# Patient Record
Sex: Male | Born: 1975
Health system: Southern US, Community
[De-identification: ages and names within clinical notes are randomized; demographics above are authoritative.]

## PROBLEM LIST (undated history)

## (undated) DIAGNOSIS — R011 Cardiac murmur, unspecified: Secondary | ICD-10-CM

## (undated) DIAGNOSIS — J111 Influenza due to unidentified influenza virus with other respiratory manifestations: Secondary | ICD-10-CM

## (undated) DIAGNOSIS — J45909 Unspecified asthma, uncomplicated: Secondary | ICD-10-CM

## (undated) DIAGNOSIS — T7840XA Allergy, unspecified, initial encounter: Secondary | ICD-10-CM

## (undated) DIAGNOSIS — M1990 Unspecified osteoarthritis, unspecified site: Secondary | ICD-10-CM

## (undated) DIAGNOSIS — G4730 Sleep apnea, unspecified: Secondary | ICD-10-CM

## (undated) HISTORY — DX: Allergy, unspecified, initial encounter: T78.40XA

## (undated) HISTORY — PX: WISDOM TOOTH EXTRACTION: SHX21

## (undated) HISTORY — DX: Influenza due to unidentified influenza virus with other respiratory manifestations: J11.1

## (undated) HISTORY — DX: Unspecified osteoarthritis, unspecified site: M19.90

## (undated) HISTORY — DX: Sleep apnea, unspecified: G47.30

## (undated) HISTORY — DX: Cardiac murmur, unspecified: R01.1

## (undated) HISTORY — DX: Unspecified asthma, uncomplicated: J45.909

---

## 1998-07-20 ENCOUNTER — Inpatient Hospital Stay (HOSPITAL_COMMUNITY): Admission: AD | Admit: 1998-07-20 | Discharge: 1998-07-24 | Payer: Self-pay | Admitting: *Deleted

## 2005-11-04 ENCOUNTER — Ambulatory Visit: Payer: Self-pay | Admitting: Family Medicine

## 2005-11-08 ENCOUNTER — Ambulatory Visit: Payer: Self-pay | Admitting: Family Medicine

## 2005-12-17 ENCOUNTER — Ambulatory Visit: Payer: Self-pay | Admitting: Family Medicine

## 2006-03-06 ENCOUNTER — Ambulatory Visit: Payer: Self-pay | Admitting: Family Medicine

## 2006-04-24 ENCOUNTER — Ambulatory Visit: Payer: Self-pay | Admitting: Family Medicine

## 2006-10-20 ENCOUNTER — Ambulatory Visit: Payer: Self-pay | Admitting: Family Medicine

## 2015-10-01 DIAGNOSIS — J029 Acute pharyngitis, unspecified: Secondary | ICD-10-CM | POA: Diagnosis not present

## 2015-10-01 DIAGNOSIS — J309 Allergic rhinitis, unspecified: Secondary | ICD-10-CM | POA: Diagnosis not present

## 2015-12-22 DIAGNOSIS — H5213 Myopia, bilateral: Secondary | ICD-10-CM | POA: Diagnosis not present

## 2016-04-09 DIAGNOSIS — B029 Zoster without complications: Secondary | ICD-10-CM | POA: Diagnosis not present

## 2016-04-09 DIAGNOSIS — J029 Acute pharyngitis, unspecified: Secondary | ICD-10-CM | POA: Diagnosis not present

## 2016-04-09 DIAGNOSIS — J45909 Unspecified asthma, uncomplicated: Secondary | ICD-10-CM | POA: Diagnosis not present

## 2016-05-24 ENCOUNTER — Other Ambulatory Visit: Payer: Self-pay | Admitting: Cardiology

## 2016-05-24 DIAGNOSIS — R0789 Other chest pain: Secondary | ICD-10-CM

## 2016-05-24 DIAGNOSIS — R079 Chest pain, unspecified: Secondary | ICD-10-CM | POA: Diagnosis not present

## 2016-05-24 DIAGNOSIS — Z8249 Family history of ischemic heart disease and other diseases of the circulatory system: Secondary | ICD-10-CM | POA: Diagnosis not present

## 2016-06-07 ENCOUNTER — Ambulatory Visit
Admission: RE | Admit: 2016-06-07 | Discharge: 2016-06-07 | Disposition: A | Discharge status: Home / Self Care | Payer: No Typology Code available for payment source | Source: Ambulatory Visit | Attending: Cardiology | Admitting: Cardiology

## 2016-06-07 DIAGNOSIS — Z8249 Family history of ischemic heart disease and other diseases of the circulatory system: Secondary | ICD-10-CM | POA: Diagnosis not present

## 2016-06-07 DIAGNOSIS — R079 Chest pain, unspecified: Secondary | ICD-10-CM | POA: Diagnosis not present

## 2016-06-07 DIAGNOSIS — R0789 Other chest pain: Secondary | ICD-10-CM

## 2017-03-23 DIAGNOSIS — Z23 Encounter for immunization: Secondary | ICD-10-CM | POA: Diagnosis not present

## 2017-05-09 DIAGNOSIS — J301 Allergic rhinitis due to pollen: Secondary | ICD-10-CM | POA: Diagnosis not present

## 2017-05-09 DIAGNOSIS — J309 Allergic rhinitis, unspecified: Secondary | ICD-10-CM | POA: Diagnosis not present

## 2017-05-09 DIAGNOSIS — J4520 Mild intermittent asthma, uncomplicated: Secondary | ICD-10-CM | POA: Diagnosis not present

## 2017-05-09 DIAGNOSIS — J3089 Other allergic rhinitis: Secondary | ICD-10-CM | POA: Diagnosis not present

## 2017-05-12 ENCOUNTER — Other Ambulatory Visit: Payer: Self-pay | Admitting: Allergy

## 2017-05-12 ENCOUNTER — Ambulatory Visit
Admission: RE | Admit: 2017-05-12 | Discharge: 2017-05-12 | Disposition: A | Discharge status: Home / Self Care | Payer: BLUE CROSS/BLUE SHIELD | Source: Ambulatory Visit | Attending: Allergy | Admitting: Allergy

## 2017-05-12 DIAGNOSIS — R062 Wheezing: Secondary | ICD-10-CM | POA: Diagnosis not present

## 2017-05-12 DIAGNOSIS — J4520 Mild intermittent asthma, uncomplicated: Secondary | ICD-10-CM

## 2017-12-11 IMAGING — CT CT HEART SCORING
1 of 2 series · 11 of 20 positions shown, 14 images · non-contrast
Comparison: None.

CLINICAL DATA: Could intermittent chest discomfort.

EXAM:
CT HEART FOR CALCIUM SCORING
TECHNIQUE: CT heart was performed on a 64 channel system using prospective ECG
gating.
A non-contrast exam for calcium scoring was performed.
Note that this exam targets the heart and the chest was not imaged
in its entirety.

[Series 2: smartscore - gated 0.4 sec · axial · 0.49mm/px · z∈[-247,-132]mm · 11 of 56 slices shown, 14 images]
[im 5/56  vessel]
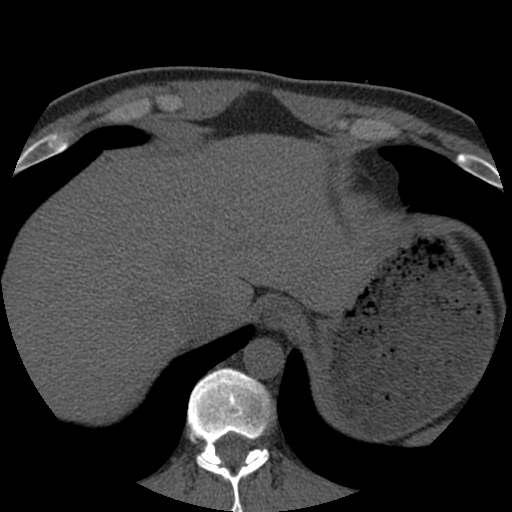
[im 5/56  lung]
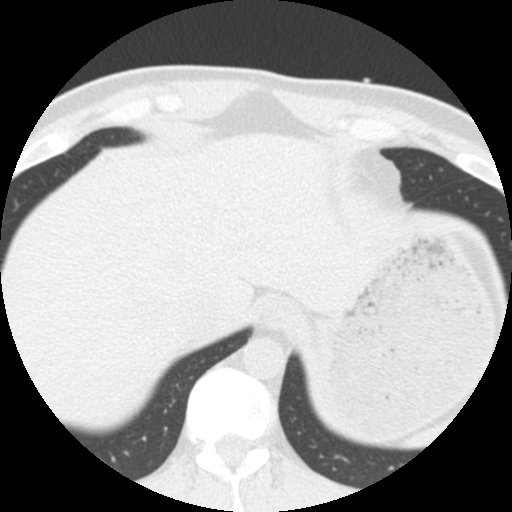
[im 10/56  vessel]
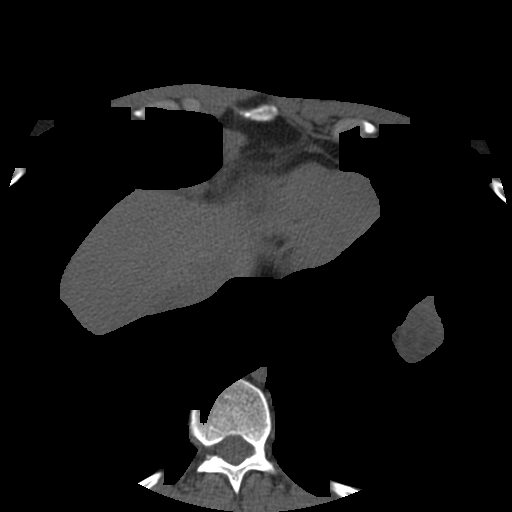
[im 14/56  vessel]
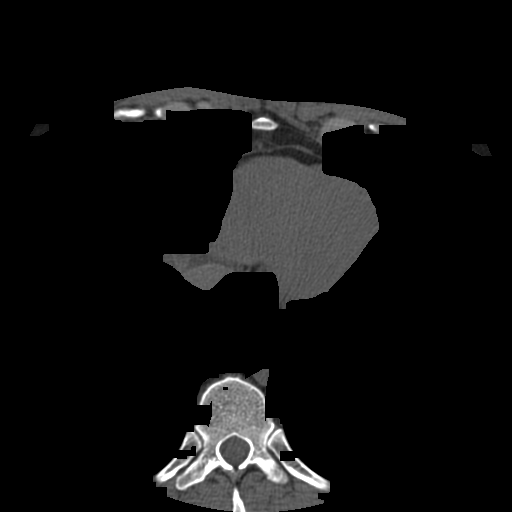
[im 19/56  vessel]
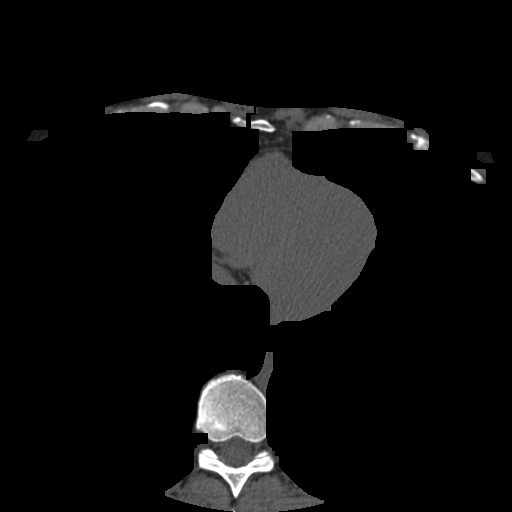
[im 23/56  vessel]
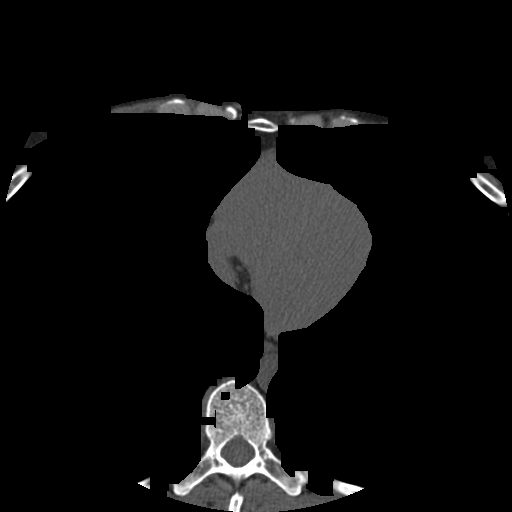
[im 23/56  lung]
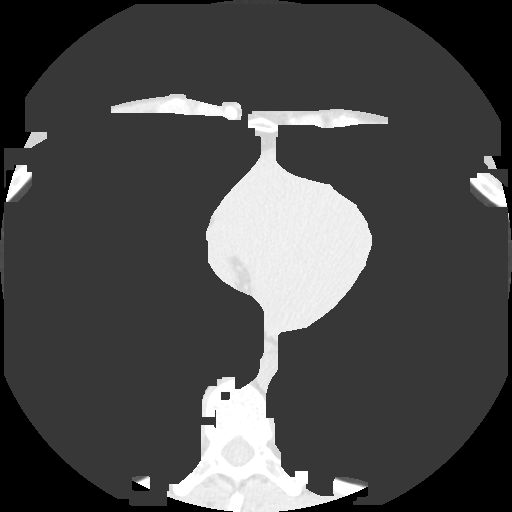
[im 28/56  vessel]
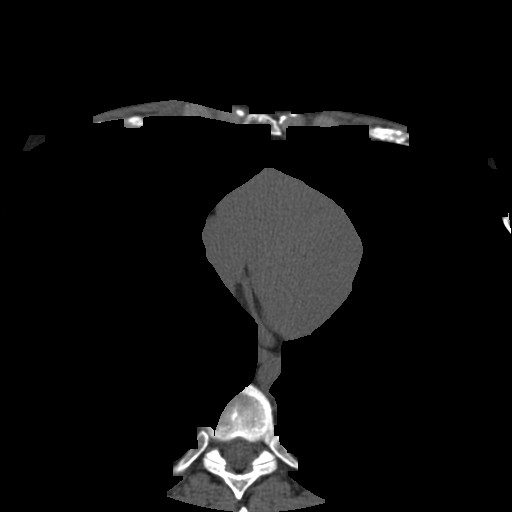
[im 33/56  vessel]
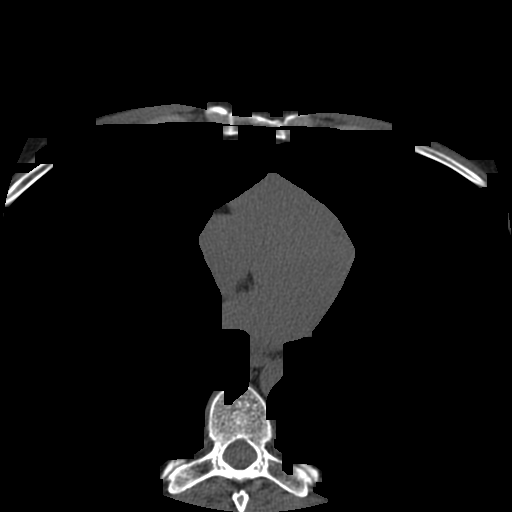
[im 37/56  vessel]
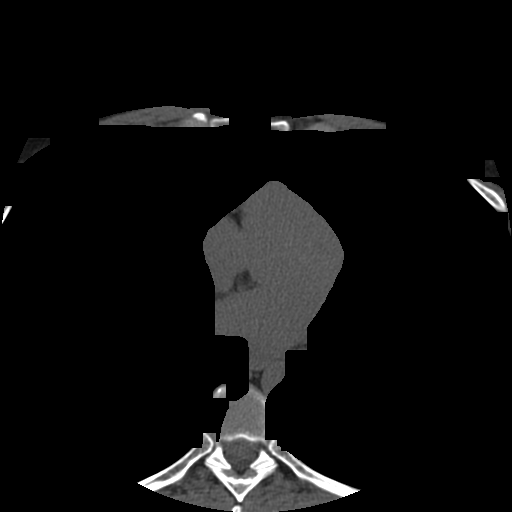
[im 42/56  vessel]
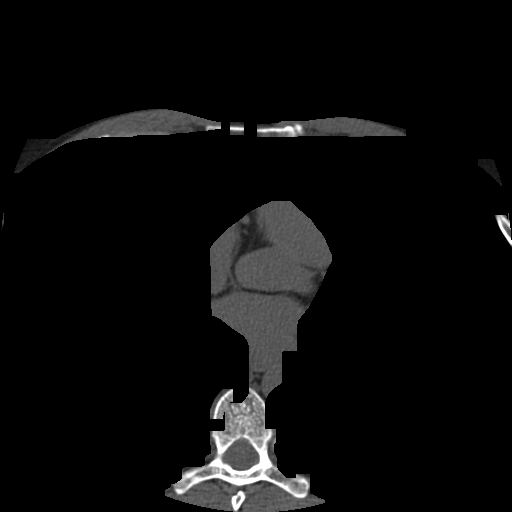
[im 42/56  lung]
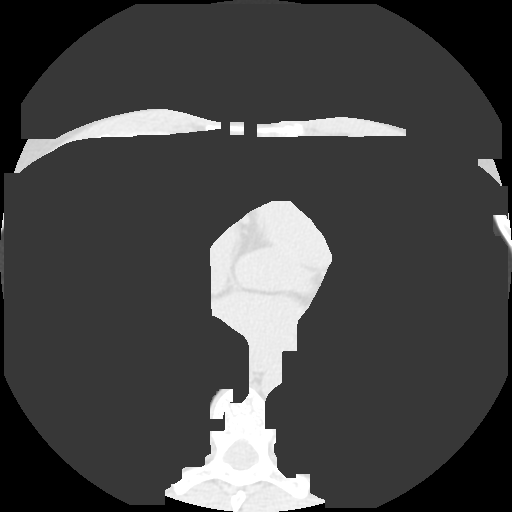
[im 46/56  vessel]
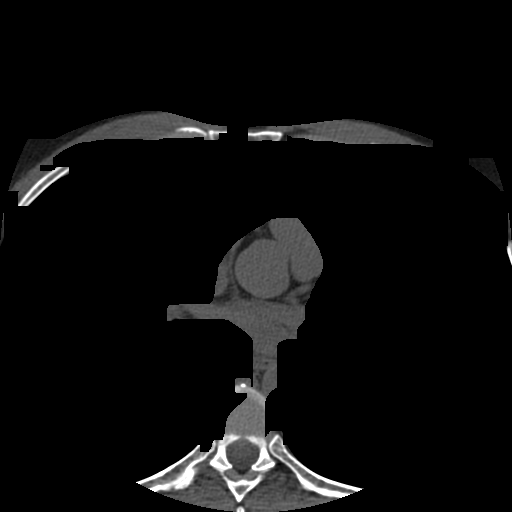
[im 51/56  vessel]
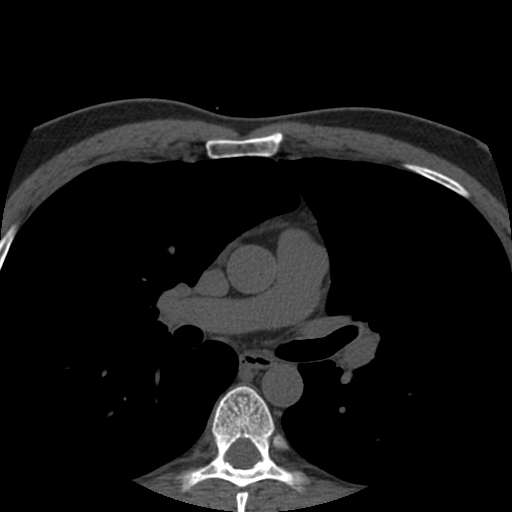

[11 of 20 positions shown; findings below may reference images not displayed]

FINDINGS: Technical quality: Good.

CORONARY CALCIUM

Total Agatston Score: 0

[HOSPITAL] percentile:  0

OTHER FINDINGS:

Cardiovascular: Heart is normal size. Visualized aorta is normal
caliber.

Mediastinum/Nodes: No adenopathy in the visualized lower mediastinum
or hila.

Lungs/Pleura: Visualized lungs are clear.  No effusions.

Upper Abdomen: Imaging into the upper abdomen shows no acute
findings.

Musculoskeletal: Visualized chest wall soft tissues unremarkable. No
acute bony abnormality.
IMPRESSION: No coronary calcium.

No acute or significant extracardiac abnormality.

## 2018-11-15 IMAGING — CR DG CHEST 2V
2 series · 2 of 2 positions shown · non-contrast
Comparison: None.

CLINICAL DATA: Intermittent wheezing

EXAM:
CHEST  2 VIEW

[w chest pa]
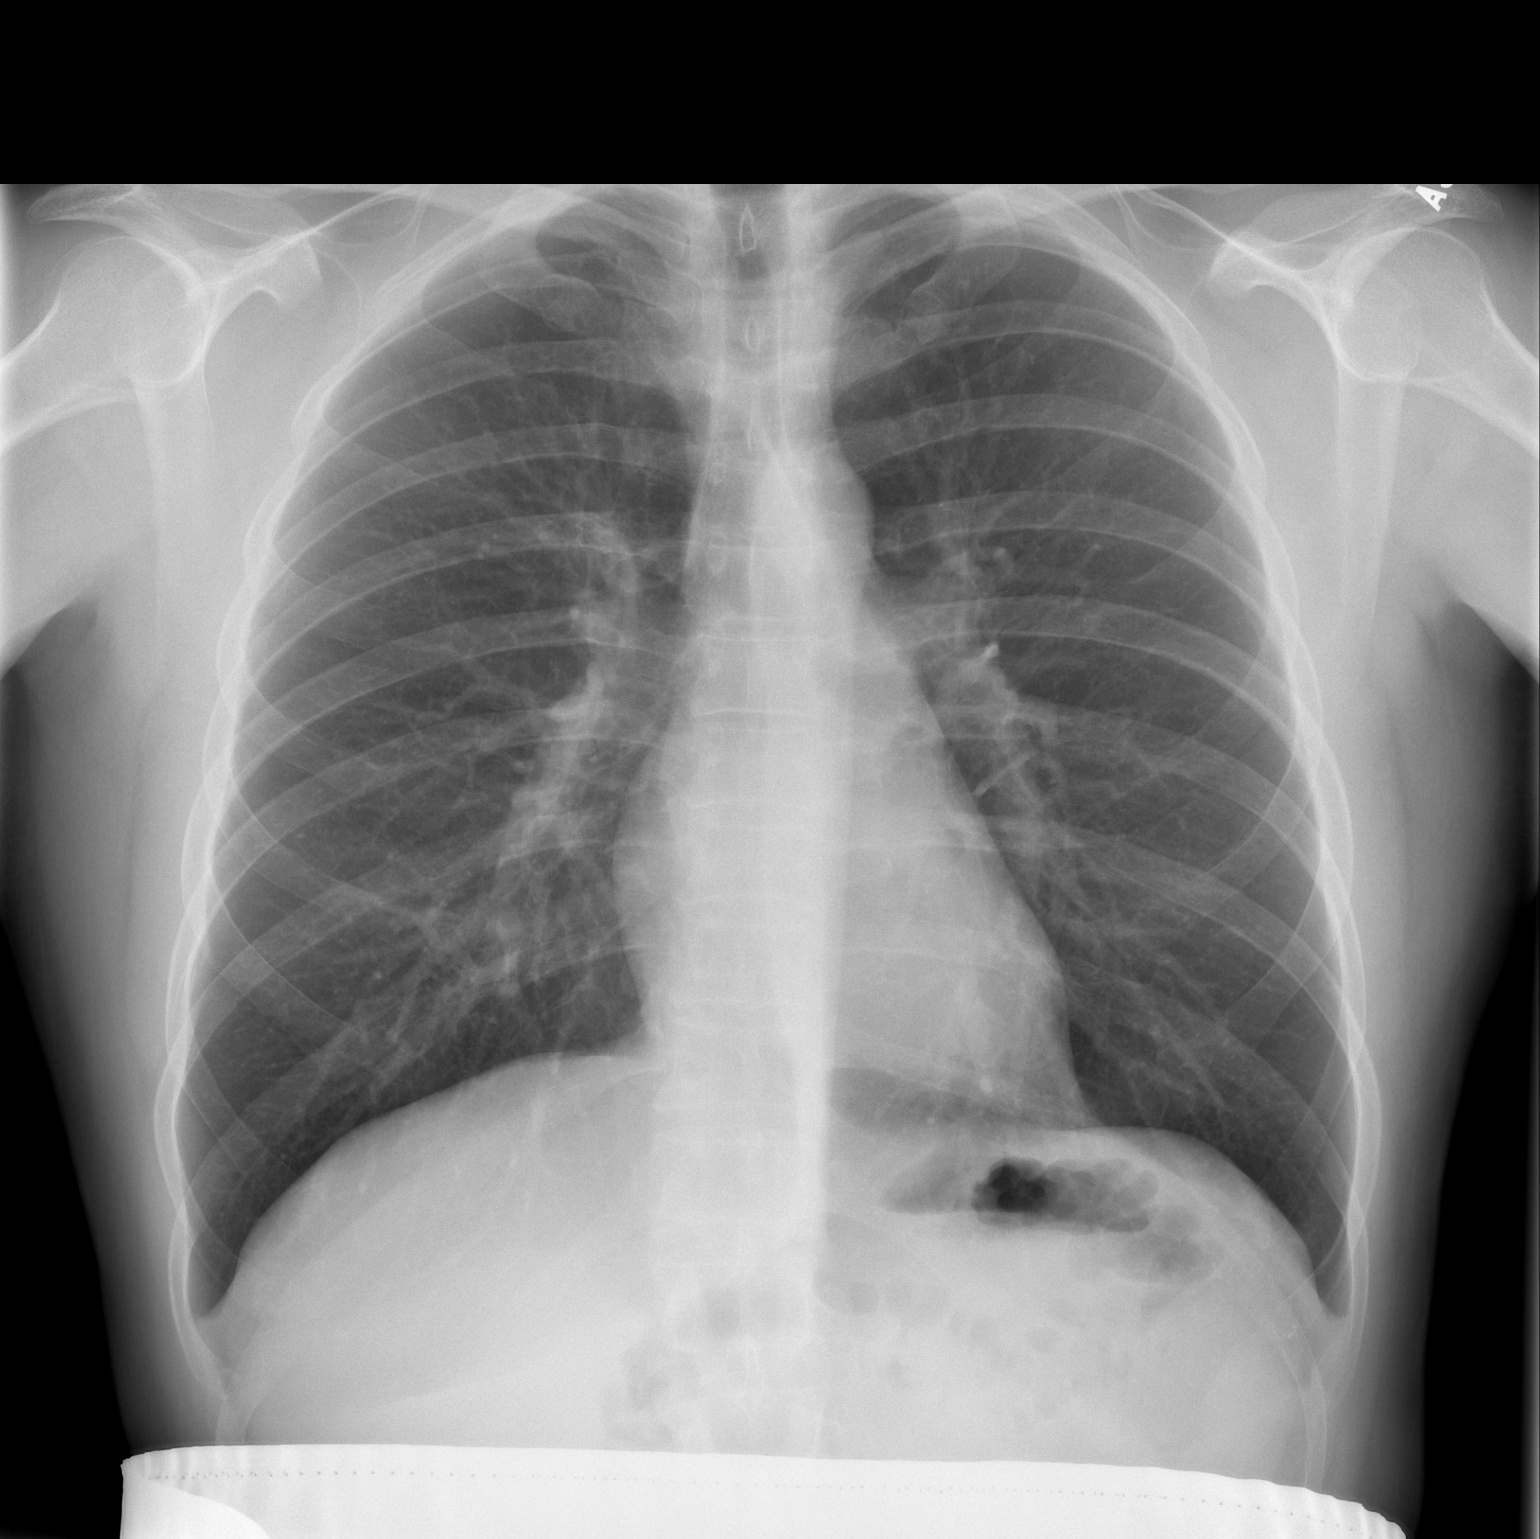

[w chest lat]
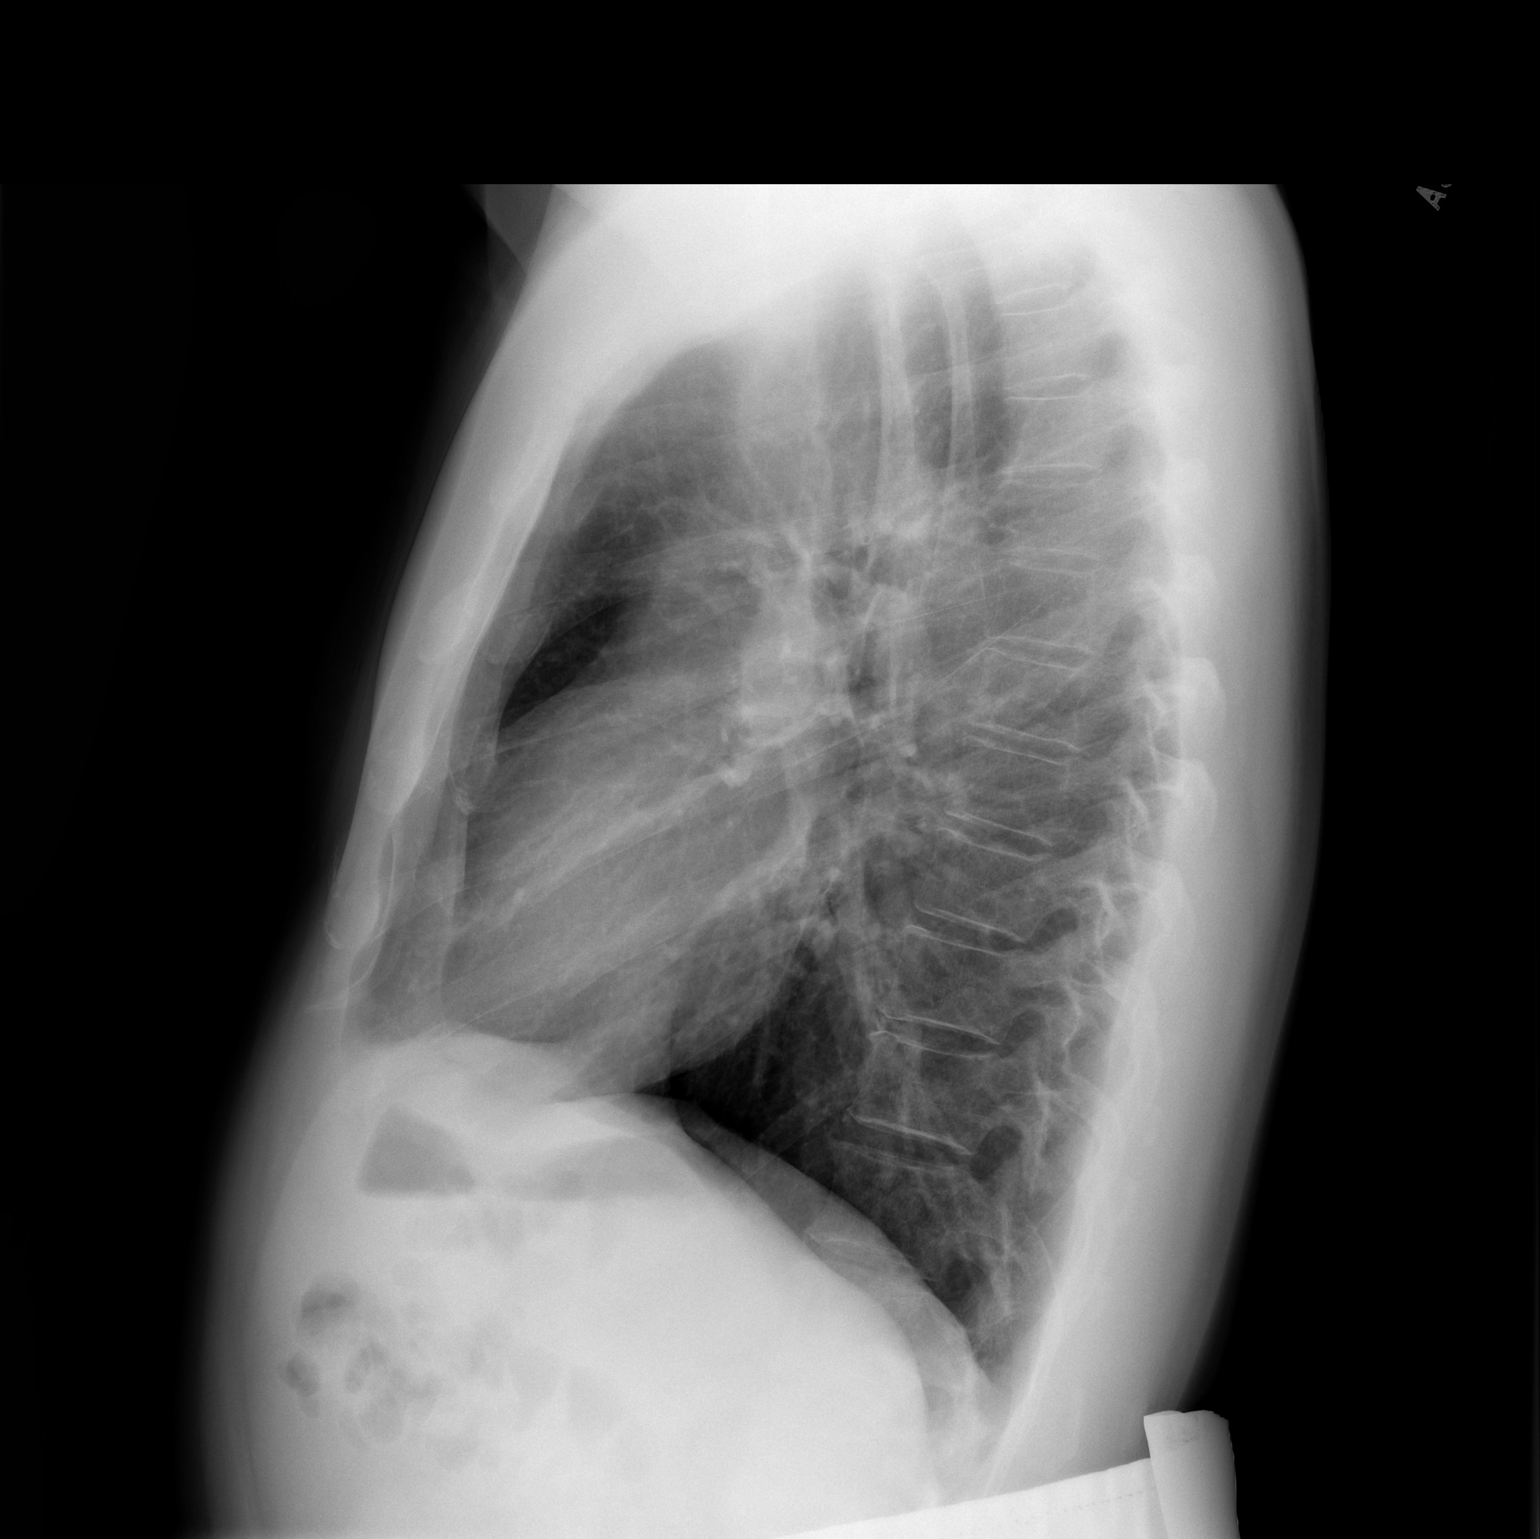

[2 of 2 positions shown; findings below may reference images not displayed]

FINDINGS: The heart size and mediastinal contours are within normal limits.
Both lungs are clear. The visualized skeletal structures are
unremarkable.
IMPRESSION: No active cardiopulmonary disease.

## 2019-04-30 DIAGNOSIS — F4323 Adjustment disorder with mixed anxiety and depressed mood: Secondary | ICD-10-CM | POA: Diagnosis not present

## 2019-06-11 DIAGNOSIS — F4323 Adjustment disorder with mixed anxiety and depressed mood: Secondary | ICD-10-CM | POA: Diagnosis not present

## 2019-06-25 DIAGNOSIS — F4323 Adjustment disorder with mixed anxiety and depressed mood: Secondary | ICD-10-CM | POA: Diagnosis not present

## 2019-07-09 DIAGNOSIS — F331 Major depressive disorder, recurrent, moderate: Secondary | ICD-10-CM | POA: Diagnosis not present

## 2019-07-09 DIAGNOSIS — Z79891 Long term (current) use of opiate analgesic: Secondary | ICD-10-CM | POA: Diagnosis not present

## 2019-07-09 DIAGNOSIS — F419 Anxiety disorder, unspecified: Secondary | ICD-10-CM | POA: Diagnosis not present

## 2019-07-23 DIAGNOSIS — F4323 Adjustment disorder with mixed anxiety and depressed mood: Secondary | ICD-10-CM | POA: Diagnosis not present

## 2019-07-30 DIAGNOSIS — F4323 Adjustment disorder with mixed anxiety and depressed mood: Secondary | ICD-10-CM | POA: Diagnosis not present

## 2019-08-06 DIAGNOSIS — F4323 Adjustment disorder with mixed anxiety and depressed mood: Secondary | ICD-10-CM | POA: Diagnosis not present

## 2019-08-19 DIAGNOSIS — F3131 Bipolar disorder, current episode depressed, mild: Secondary | ICD-10-CM | POA: Diagnosis not present

## 2019-09-10 DIAGNOSIS — F4323 Adjustment disorder with mixed anxiety and depressed mood: Secondary | ICD-10-CM | POA: Diagnosis not present

## 2019-09-17 DIAGNOSIS — F3131 Bipolar disorder, current episode depressed, mild: Secondary | ICD-10-CM | POA: Diagnosis not present

## 2019-09-24 DIAGNOSIS — F4323 Adjustment disorder with mixed anxiety and depressed mood: Secondary | ICD-10-CM | POA: Diagnosis not present

## 2019-10-08 DIAGNOSIS — J301 Allergic rhinitis due to pollen: Secondary | ICD-10-CM | POA: Diagnosis not present

## 2019-10-08 DIAGNOSIS — J3081 Allergic rhinitis due to animal (cat) (dog) hair and dander: Secondary | ICD-10-CM | POA: Diagnosis not present

## 2019-10-08 DIAGNOSIS — J3089 Other allergic rhinitis: Secondary | ICD-10-CM | POA: Diagnosis not present

## 2019-10-08 DIAGNOSIS — J4520 Mild intermittent asthma, uncomplicated: Secondary | ICD-10-CM | POA: Diagnosis not present

## 2019-10-22 DIAGNOSIS — F4323 Adjustment disorder with mixed anxiety and depressed mood: Secondary | ICD-10-CM | POA: Diagnosis not present

## 2019-11-05 DIAGNOSIS — F4323 Adjustment disorder with mixed anxiety and depressed mood: Secondary | ICD-10-CM | POA: Diagnosis not present

## 2019-11-15 DIAGNOSIS — B354 Tinea corporis: Secondary | ICD-10-CM | POA: Diagnosis not present

## 2019-11-19 DIAGNOSIS — F4323 Adjustment disorder with mixed anxiety and depressed mood: Secondary | ICD-10-CM | POA: Diagnosis not present

## 2019-12-03 DIAGNOSIS — F4323 Adjustment disorder with mixed anxiety and depressed mood: Secondary | ICD-10-CM | POA: Diagnosis not present

## 2019-12-09 DIAGNOSIS — B029 Zoster without complications: Secondary | ICD-10-CM | POA: Diagnosis not present

## 2019-12-31 DIAGNOSIS — F4323 Adjustment disorder with mixed anxiety and depressed mood: Secondary | ICD-10-CM | POA: Diagnosis not present

## 2020-01-25 DIAGNOSIS — Z3009 Encounter for other general counseling and advice on contraception: Secondary | ICD-10-CM | POA: Diagnosis not present

## 2020-02-11 DIAGNOSIS — Z1331 Encounter for screening for depression: Secondary | ICD-10-CM | POA: Diagnosis not present

## 2020-02-11 DIAGNOSIS — Z13220 Encounter for screening for lipoid disorders: Secondary | ICD-10-CM | POA: Diagnosis not present

## 2020-02-11 DIAGNOSIS — Z0000 Encounter for general adult medical examination without abnormal findings: Secondary | ICD-10-CM | POA: Diagnosis not present

## 2020-02-11 DIAGNOSIS — Z23 Encounter for immunization: Secondary | ICD-10-CM | POA: Diagnosis not present

## 2020-03-08 DIAGNOSIS — B084 Enteroviral vesicular stomatitis with exanthem: Secondary | ICD-10-CM | POA: Diagnosis not present

## 2020-03-13 HISTORY — PX: VASECTOMY: SHX75

## 2020-03-20 DIAGNOSIS — Z302 Encounter for sterilization: Secondary | ICD-10-CM | POA: Diagnosis not present

## 2020-06-15 ENCOUNTER — Ambulatory Visit: Payer: BC Managed Care – PPO | Admitting: Podiatry

## 2020-06-15 ENCOUNTER — Ambulatory Visit: Payer: BC Managed Care – PPO

## 2020-06-15 ENCOUNTER — Other Ambulatory Visit: Payer: Self-pay

## 2020-06-15 ENCOUNTER — Ambulatory Visit (INDEPENDENT_AMBULATORY_CARE_PROVIDER_SITE_OTHER): Payer: BC Managed Care – PPO

## 2020-06-15 DIAGNOSIS — M2011 Hallux valgus (acquired), right foot: Secondary | ICD-10-CM

## 2020-06-15 DIAGNOSIS — M2012 Hallux valgus (acquired), left foot: Secondary | ICD-10-CM

## 2020-06-15 DIAGNOSIS — M21619 Bunion of unspecified foot: Secondary | ICD-10-CM

## 2020-06-15 DIAGNOSIS — M21612 Bunion of left foot: Secondary | ICD-10-CM

## 2020-06-15 DIAGNOSIS — M21611 Bunion of right foot: Secondary | ICD-10-CM

## 2020-06-18 ENCOUNTER — Encounter: Payer: Self-pay | Admitting: Podiatry

## 2020-06-18 NOTE — Progress Notes (Signed)
  Subjective:  Patient ID: Edwin Amis., male    DOB: [birth year],  MRN: [id number]  Chief Complaint  Patient presents with  . Bunions    Right foot pain on the side of the big toe    45 y.o. male presents with the above complaint. History confirmed with patient.  This is a progressively worsening issue for him.  Now causing pain in his shoe gear.  Wider shoes have not helped.  He also has one on the left side but is not currently painful.  He is interested in permanent surgical correction.  Objective:  Physical Exam: warm, good capillary refill, no trophic changes or ulcerative lesions, normal DP and PT pulses and normal sensory exam. Left Foot: Moderate hallux valgus with good range of motion of the 1st MTPJ, no gross hypermobility of the 1st ray Right Foot: Moderate to severe hallux valgus with good range of motion of the 1st MTPJ, no gross hypermobility of the 1st ray, pain over the medial eminence and within the joint    Radiographs: X-ray of the right foot: Moderate increased 1st intermetatarsal angle, hallux interphalangeus and hallux valgus is present Assessment:   1. Hallux valgus (acquired), right foot   2. Acquired hallux interphalangeus of right foot   3. Hallux valgus with bunions, left      Plan:  Patient was evaluated and treated and all questions answered.  Discussed the etiology and treatment including surgical and non surgical treatment for painful bunions .  He has exhausted all non surgical treatment prior to this visit including shoe gear changes and padding.  He desires surgical intervention. We discussed all risks including but not limited to: pain, swelling, infection, scar, numbness which may be temporary or permanent, chronic pain, stiffness, nerve pain or damage, wound healing problems, bone healing problems including delayed or non-union and recurrence. Specifically we discussed the following procedures: Minimally invasive bunionectomy. Informed  consent was signed today. Surgery will be scheduled at a mutually agreeable date. Information regarding this will be forwarded to our surgery scheduler.   Surgical plan:  Procedure: -Minimally invasive bunionectomy the right foot  Location: [facility]  Anesthesia plan: -IV sedation with local anesthesia  Postoperative pain plan: - Tylenol 1000 mg every 6 hours, ibuprofen 600 mg every 6 hours, gabapentin 300 mg every 8 hours x5 days, oxycodone 5 mg 1-2 tabs every 6 hours only as needed  DVT prophylaxis: -None required  WB Restrictions / DME needs: -WBAT in surgical shoe dispensed today   No follow-ups on file.

## 2020-06-20 DIAGNOSIS — N5089 Other specified disorders of the male genital organs: Secondary | ICD-10-CM | POA: Diagnosis not present

## 2020-07-11 ENCOUNTER — Ambulatory Visit: Payer: Self-pay

## 2020-07-11 ENCOUNTER — Ambulatory Visit (INDEPENDENT_AMBULATORY_CARE_PROVIDER_SITE_OTHER): Payer: BC Managed Care – PPO | Admitting: Family Medicine

## 2020-07-11 ENCOUNTER — Ambulatory Visit: Payer: BLUE CROSS/BLUE SHIELD | Admitting: Orthopaedic Surgery

## 2020-07-11 ENCOUNTER — Other Ambulatory Visit: Payer: Self-pay

## 2020-07-11 DIAGNOSIS — M545 Low back pain, unspecified: Secondary | ICD-10-CM

## 2020-07-11 DIAGNOSIS — M542 Cervicalgia: Secondary | ICD-10-CM

## 2020-07-11 MED ORDER — BACLOFEN 10 MG PO TABS: 5.0000 mg | ORAL_TABLET | Freq: Every evening | ORAL | 3 refills | Status: DC | PRN

## 2020-07-11 MED ORDER — NABUMETONE 500 MG PO TABS: 500.0000 mg | ORAL_TABLET | Freq: Two times a day (BID) | ORAL | 3 refills | Status: DC | PRN

## 2020-07-11 NOTE — Patient Instructions (Signed)
    Consider trying an elimination diet.  Whole 30 is a good example:  [email or web address]
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
## 2020-08-02 DIAGNOSIS — J45909 Unspecified asthma, uncomplicated: Secondary | ICD-10-CM | POA: Diagnosis not present

## 2020-08-02 DIAGNOSIS — Z1331 Encounter for screening for depression: Secondary | ICD-10-CM | POA: Diagnosis not present

## 2020-08-02 DIAGNOSIS — Z0000 Encounter for general adult medical examination without abnormal findings: Secondary | ICD-10-CM | POA: Diagnosis not present

## 2020-08-02 DIAGNOSIS — Z1389 Encounter for screening for other disorder: Secondary | ICD-10-CM | POA: Diagnosis not present

## 2020-08-02 DIAGNOSIS — Z125 Encounter for screening for malignant neoplasm of prostate: Secondary | ICD-10-CM | POA: Diagnosis not present

## 2020-08-14 ENCOUNTER — Telehealth: Payer: Self-pay | Admitting: Urology

## 2020-08-14 NOTE — Telephone Encounter (Signed)
DOS - 09/01/20  DOUBLE OSTEOTOMY RIGHT --- 28299   [employer]BS EFFECTIVE DATE - 05/13/20  PLAN DEDUCTIBLE - $7,000.00 W/ $6,998.99 REMAINING OUT OF POCKET - $8,700.00 W/ $8,462.79 REMAINING COPAY - $0.00 COINSURANCE - 50%   PER BCBS WEBSITE NO PRIOR AUTH IS REQUIRED FOR CPT CODE 56701.

## 2020-08-22 DIAGNOSIS — M2021 Hallux rigidus, right foot: Secondary | ICD-10-CM | POA: Diagnosis not present

## 2020-08-22 DIAGNOSIS — M2011 Hallux valgus (acquired), right foot: Secondary | ICD-10-CM | POA: Diagnosis not present

## 2020-08-22 DIAGNOSIS — M2012 Hallux valgus (acquired), left foot: Secondary | ICD-10-CM | POA: Diagnosis not present

## 2020-08-23 ENCOUNTER — Telehealth: Payer: Self-pay

## 2020-08-23 NOTE — Telephone Encounter (Signed)
Edwin Fox called to cancel his surgery with Dr. Sherryle Lis on 09/01/2020. He stated he would like to hold off for right now and he will call me back when he's ready to reschedule. Notified Dr. Sherryle Lis and Caren Griffins with [facility]

## 2020-08-23 NOTE — Telephone Encounter (Signed)
Ok thank you 

## 2020-09-07 ENCOUNTER — Encounter: Payer: BC Managed Care – PPO | Admitting: Podiatry

## 2020-09-14 ENCOUNTER — Encounter: Payer: BC Managed Care – PPO | Admitting: Podiatry

## 2020-09-28 ENCOUNTER — Encounter: Payer: BC Managed Care – PPO | Admitting: Podiatry

## 2020-10-04 DIAGNOSIS — J029 Acute pharyngitis, unspecified: Secondary | ICD-10-CM | POA: Diagnosis not present

## 2020-12-05 DIAGNOSIS — S6991XA Unspecified injury of right wrist, hand and finger(s), initial encounter: Secondary | ICD-10-CM | POA: Diagnosis not present

## 2021-02-09 DIAGNOSIS — J3089 Other allergic rhinitis: Secondary | ICD-10-CM | POA: Diagnosis not present

## 2021-02-09 DIAGNOSIS — J4520 Mild intermittent asthma, uncomplicated: Secondary | ICD-10-CM | POA: Diagnosis not present

## 2021-02-09 DIAGNOSIS — J3081 Allergic rhinitis due to animal (cat) (dog) hair and dander: Secondary | ICD-10-CM | POA: Diagnosis not present

## 2021-02-09 DIAGNOSIS — J301 Allergic rhinitis due to pollen: Secondary | ICD-10-CM | POA: Diagnosis not present

## 2021-03-08 DIAGNOSIS — R5383 Other fatigue: Secondary | ICD-10-CM | POA: Diagnosis not present

## 2021-03-08 DIAGNOSIS — J069 Acute upper respiratory infection, unspecified: Secondary | ICD-10-CM | POA: Diagnosis not present

## 2021-03-08 DIAGNOSIS — J029 Acute pharyngitis, unspecified: Secondary | ICD-10-CM | POA: Diagnosis not present

## 2021-04-10 DIAGNOSIS — M25531 Pain in right wrist: Secondary | ICD-10-CM | POA: Diagnosis not present

## 2021-06-01 DIAGNOSIS — Z79891 Long term (current) use of opiate analgesic: Secondary | ICD-10-CM | POA: Diagnosis not present

## 2021-06-01 DIAGNOSIS — F419 Anxiety disorder, unspecified: Secondary | ICD-10-CM | POA: Diagnosis not present

## 2021-06-01 DIAGNOSIS — F331 Major depressive disorder, recurrent, moderate: Secondary | ICD-10-CM | POA: Diagnosis not present

## 2021-06-12 DIAGNOSIS — M25531 Pain in right wrist: Secondary | ICD-10-CM | POA: Diagnosis not present

## 2021-06-15 DIAGNOSIS — R062 Wheezing: Secondary | ICD-10-CM | POA: Diagnosis not present

## 2021-06-15 DIAGNOSIS — R509 Fever, unspecified: Secondary | ICD-10-CM | POA: Diagnosis not present

## 2021-06-15 DIAGNOSIS — J45909 Unspecified asthma, uncomplicated: Secondary | ICD-10-CM | POA: Diagnosis not present

## 2021-06-17 ENCOUNTER — Other Ambulatory Visit: Payer: Self-pay

## 2021-06-17 ENCOUNTER — Emergency Department (HOSPITAL_BASED_OUTPATIENT_CLINIC_OR_DEPARTMENT_OTHER)
Admission: EM | Admit: 2021-06-17 | Discharge: 2021-06-17 | Disposition: A | Discharge status: Home / Self Care | Payer: BC Managed Care – PPO | Attending: Emergency Medicine | Admitting: Emergency Medicine

## 2021-06-17 DIAGNOSIS — R509 Fever, unspecified: Secondary | ICD-10-CM | POA: Diagnosis not present

## 2021-06-17 DIAGNOSIS — D696 Thrombocytopenia, unspecified: Secondary | ICD-10-CM | POA: Insufficient documentation

## 2021-06-17 DIAGNOSIS — Z20822 Contact with and (suspected) exposure to covid-19: Secondary | ICD-10-CM | POA: Diagnosis not present

## 2021-06-17 DIAGNOSIS — R7401 Elevation of levels of liver transaminase levels: Secondary | ICD-10-CM | POA: Diagnosis not present

## 2021-06-17 DIAGNOSIS — R21 Rash and other nonspecific skin eruption: Secondary | ICD-10-CM | POA: Diagnosis not present

## 2021-06-17 DIAGNOSIS — J101 Influenza due to other identified influenza virus with other respiratory manifestations: Secondary | ICD-10-CM | POA: Diagnosis not present

## 2021-06-17 LAB — COMPREHENSIVE METABOLIC PANEL
ALT: 162 U/L — ABNORMAL HIGH (ref 0–44)
AST: 215 U/L — ABNORMAL HIGH (ref 15–41)
Albumin: 4.2 g/dL (ref 3.5–5.0)
Alkaline Phosphatase: 92 U/L (ref 38–126)
Anion gap: 12 (ref 5–15)
BUN: 16 mg/dL (ref 6–20)
CO2: 24 mmol/L (ref 22–32)
Calcium: 9 mg/dL (ref 8.9–10.3)
Chloride: 99 mmol/L (ref 98–111)
Creatinine, Ser: 0.73 mg/dL (ref 0.61–1.24)
GFR, Estimated: >60 mL/min (ref >60)
Glucose, Bld: 128 mg/dL — ABNORMAL HIGH (ref 70–99)
Potassium: 3.5 mmol/L (ref 3.5–5.1)
Sodium: 135 mmol/L (ref 135–145)
Total Bilirubin: 0.8 mg/dL (ref 0.3–1.2)
Total Protein: 7.2 g/dL (ref 6.5–8.1)

## 2021-06-17 LAB — CBC WITH DIFFERENTIAL/PLATELET
Abs Immature Granulocytes: 0.01 10*3/uL (ref 0.00–0.07)
Basophils Absolute: 0 10*3/uL (ref 0.0–0.1)
Basophils Relative: 0 %
Eosinophils Absolute: 0.1 10*3/uL (ref 0.0–0.5)
Eosinophils Relative: 2 %
HCT: 41.6 % (ref 39.0–52.0)
Hemoglobin: 14.1 g/dL (ref 13.0–17.0)
Immature Granulocytes: 0 %
Lymphocytes Relative: 14 %
Lymphs Abs: 0.6 10*3/uL — ABNORMAL LOW (ref 0.7–4.0)
MCH: 29.6 pg (ref 26.0–34.0)
MCHC: 33.9 g/dL (ref 30.0–36.0)
MCV: 87.2 fL (ref 80.0–100.0)
Monocytes Absolute: 0.1 10*3/uL (ref 0.1–1.0)
Monocytes Relative: 3 %
Neutro Abs: 3.5 10*3/uL (ref 1.7–7.7)
Neutrophils Relative %: 81 %
Platelets: 114 10*3/uL — ABNORMAL LOW (ref 150–400)
RBC: 4.77 MIL/uL (ref 4.22–5.81)
RDW: 11.9 % (ref 11.5–15.5)
WBC: 4.4 10*3/uL (ref 4.0–10.5)
nRBC: 0 % (ref 0.0–0.2)

## 2021-06-17 LAB — LACTIC ACID, PLASMA: Lactic Acid, Venous: 1.1 mmol/L (ref 0.5–1.9)

## 2021-06-17 LAB — RESP PANEL BY RT-PCR (FLU A&B, COVID) ARPGX2
Influenza A by PCR: POSITIVE — AB
Influenza B by PCR: NEGATIVE
SARS Coronavirus 2 by RT PCR: NEGATIVE

## 2021-06-17 LAB — GROUP A STREP BY PCR: Group A Strep by PCR: NOT DETECTED

## 2021-06-17 MED ORDER — OXYCODONE HCL 5 MG PO TABS: 5.0000 mg | ORAL_TABLET | Freq: Four times a day (QID) | ORAL | 0 refills | Status: DC | PRN

## 2021-06-17 MED ORDER — MORPHINE SULFATE (PF) 4 MG/ML IV SOLN
4.0000 mg | Freq: Once | INTRAVENOUS | Status: AC
  Administered 2021-06-17: 4 mg via INTRAVENOUS
  Filled 2021-06-17: qty 1

## 2021-06-17 MED ORDER — KETOROLAC TROMETHAMINE 30 MG/ML IJ SOLN
30.0000 mg | Freq: Once | INTRAMUSCULAR | Status: AC
  Administered 2021-06-17: 30 mg via INTRAVENOUS
  Filled 2021-06-17: qty 1

## 2021-06-17 MED ORDER — SODIUM CHLORIDE 0.9 % IV BOLUS
1000.0000 mL | Freq: Once | INTRAVENOUS | Status: AC
  Administered 2021-06-17: 1000 mL via INTRAVENOUS

## 2021-06-17 NOTE — Discharge Instructions (Addendum)
You are diagnosed with influenza A today.  This viral syndrome can last 6 to 10 days.  I expect you to be feeling better within the next 1 to 3 days at home.  You can continue taking ibuprofen and Tylenol as needed in the daytime, together as needed, every 6 hours for fever and chills and headache pains.  Alternatively I recommended taking NyQuil with ibuprofen at bedtime, which can help with sleep as well. We talked about your abnormal blood test, particularly your liver enzyme levels.  It is not clear if this is related to your Lamictal medicine, or to your alcohol use, or the Flu.  Your doctor should recheck your liver enzymes in 1 week. Your platelet count was also mildly low today (114). This may again be related to all of the conditions listed above.  Your doctor should recheck your platelet count in 1 week. Please STOP taking lamictal.  This may also be causing a fever and a drug rash.  You should discontinue this medication moving forward.  If you develop new blistering rash on your back, under your armpits, involving your eyes or your mouth, you should seek emergency attention.   Finally, we sent a blood culture today to ensure you do not have a blood infection.  Overall my suspicion of blood infection is quite low, given your reassuring blood test.  However, if you do test positive, you will receive a phone call in the next 1 to 3 days telling you to come back immediately to the ER for IV antibiotics.

## 2021-06-17 NOTE — ED Triage Notes (Signed)
Patient arrives via POV with intermittent fever x5 days and new rash that started this morning. Patient also reports pounding headache (rates it currently at a 5/10). Also reports one vomiting episode. Patient was evaluated by his doctor on Friday (negative for flu/covid), and but he reports a low WBC.  Last Advil dose at 0800.    Steroid shot given on Tuesday 1/31 in right hand for previous hand injury. Recently started on lamictal for bipolar disorder about 2 weeks, recently increased dose last Monday.

## 2021-06-17 NOTE — ED Provider Notes (Signed)
[place] EMERGENCY DEPT Provider Note   CSN: [id number] Arrival date & time: 06/17/21  1011     History  Chief Complaint  Patient presents with   Fever   Rash   Headache    Levin Erp Lamon Rotundo. is a 46 y.o. male presented to ED with fever, headache, malaise for 6 days.  His wife is present at bedside provides supplemental history.  They report that the wife had some mild congestion and a fever on Tuesday, at the same time the patient began having fevers at home.  He has had temperatures of 101F, intermittent fevers, worse at night, for the past 6 days.  He has also had a throbbing frontal headache intermittently for several days.  He describes joint pain and aching all over.  They were concerned that he developed a blanching red rash, which is mildly itchy, across his back yesterday.  They went to his PCPs office on Friday, over they had a rapid COVID and flu test which were negative, he was told he had some mild leukopenia, but advised to continue supportive care for possible viral syndrome.  Patient reports he took 400 mg of ibuprofen at 8 AM.  He has been taking ibuprofen regularly for his fevers at home.  His wife also reports the patient has a history of bipolar 2, which been well controlled for many years without medications, but he was started on Lamictal as a preventative measure about 2 weeks ago.  They increase his dose a week ago, on Monday, which was the day before his symptoms started.  They have discussed this with the therapist office and advised to taper off his lamictal, supposed to take last dose tomorrow (Monday).  Took meds today.  He denies any known drug allergies or self allergies or history of Stevens-Johnson syndrome or drug rash.  He denies any history of meningitis.  He does have some mild photophobia but denies neck stiffness.  HPI     Home Medications Prior to Admission medications   Medication Sig Start Date End Date Taking?  Authorizing Provider  oxyCODONE (ROXICODONE) 5 MG immediate release tablet Take 1 tablet (5 mg total) by mouth every 6 (six) hours as needed for up to 6 doses for severe pain. 06/17/21  Yes Zoila Ditullio, Carola Rhine, MD  baclofen (LIORESAL) 10 MG tablet Take 0.5-1 tablets (5-10 mg total) by mouth at bedtime as needed for muscle spasms. 07/11/20   Hilts, Legrand Como, MD  levocetirizine (XYZAL) 5 MG tablet SMARTSIG:1 Tablet(s) By Mouth Every Evening 03/13/20   [provider]  montelukast (SINGULAIR) 10 MG tablet Take 10 mg by mouth daily. 06/04/20   [provider]  nabumetone (RELAFEN) 500 MG tablet Take 1 tablet (500 mg total) by mouth 2 (two) times daily as needed. 07/11/20   Hilts, Legrand Como, MD      Allergies    Lamictal [lamotrigine]    Review of Systems   Review of Systems  Physical Exam Updated Vital Signs BP 131/77 (BP Location: Right Arm)    Pulse 67    Temp 100 F (37.8 C) (Oral)    Resp 18    Ht 5\' 7"  (1.702 m)    Wt 70.3 kg    SpO2 100%    BMI 24.28 kg/m  Physical Exam Constitutional:      General: He is not in acute distress. HENT:     Head: Normocephalic and atraumatic.     Comments: Tacky mucous membranes Eyes:  Conjunctiva/sclera: Conjunctivae normal.     Pupils: Pupils are equal, round, and reactive to light.  Cardiovascular:     Rate and Rhythm: Normal rate and regular rhythm.     Heart sounds: Normal heart sounds.  Pulmonary:     Effort: Pulmonary effort is normal. No respiratory distress.  Abdominal:     General: There is no distension.     Tenderness: There is no abdominal tenderness.  Skin:    General: Skin is warm and dry.     Comments: Blanching erythematous rash involving the back (upper and lower); mildly raised, mildly itchy; no skin sloughing, no soft mucosa sloughing  Neurological:     General: No focal deficit present.     Mental Status: He is alert. Mental status is at baseline.     GCS: GCS eye subscore is 4. GCS verbal subscore is 5. GCS motor  subscore is 6.  Psychiatric:        Mood and Affect: Mood normal.        Behavior: Behavior normal.    ED Results / Procedures / Treatments   Labs (all labs ordered are listed, but only abnormal results are displayed) Labs Reviewed  RESP PANEL BY RT-PCR (FLU A&B, COVID) ARPGX2 - Abnormal; Notable for the following components:      Result Value   Influenza A by PCR POSITIVE (*)    All other components within normal limits  COMPREHENSIVE METABOLIC PANEL - Abnormal; Notable for the following components:   Glucose, Bld 128 (*)    AST 215 (*)    ALT 162 (*)    All other components within normal limits  CBC WITH DIFFERENTIAL/PLATELET - Abnormal; Notable for the following components:   Platelets 114 (*)    Lymphs Abs 0.6 (*)    All other components within normal limits  GROUP A STREP BY PCR  CULTURE, BLOOD (SINGLE)  LACTIC ACID, PLASMA    EKG None  Radiology No results found.  Procedures Procedures    Medications Ordered in ED Medications  sodium chloride 0.9 % bolus 1,000 mL (0 mLs Intravenous Stopped 06/17/21 1213)  morphine (PF) 4 MG/ML injection 4 mg (4 mg Intravenous Given 06/17/21 1111)  ketorolac (TORADOL) 30 MG/ML injection 30 mg (30 mg Intravenous Given 06/17/21 1109)  morphine (PF) 4 MG/ML injection 4 mg (4 mg Intravenous Given 06/17/21 1248)    ED Course/ Medical Decision Making/ A&P Clinical Course as of 06/17/21 1741  Sun Jun 17, 2021  1235 Patient reassessed.  I several long discussions with the patient and his wife at the bedside regarding her diagnosis.  I do suspect now that his syndrome is related to influenza.  If that is the case, I expect that he will be feeling better within the next 1 to 3 days, given that he has had 6 days of symptoms already.  The fact that he has no leukocytosis, normal lactate level, no evidence of dehydration, lowers my suspicion overall for serious bacterial infection, sepsis.  Clinically again I have a lower suspicion for meningitis.   We did discuss the possibility of a drug rash or drug fever related to Lamictal, and I encouraged him to stop taking this medication at this time.  It is difficult to exclude this possibility.  I do not see evidence of Stevens-Johnson syndrome but we did discuss the signs or symptoms with him and his wife, who understand that this can occur up to a month after cessation of the medication. [MT]  1236 Finally we discussed his transaminitis today and his mild thrombocytopenia.  He reports he does drink alcohol daily, though he has cut back since started January, and nearly drinks 1 tall beer a day.  This pattern of mild transaminitis could be consistent with chronic etoh use. It may also be related to his influenza illness or even to Lamictal. Advised PCP recheck platelets and LFT's at the end of the week. [MT]    Clinical Course User Index [MT] Niles Ess, Carola Rhine, MD                           Medical Decision Making Amount and/or Complexity of Data Reviewed Labs: ordered.  Risk Prescription drug management.   This patient presents to the ED with concern for fever, fatigue, headache, myalgia.. This involves an extensive number of treatment options, and is a complaint that carries with it a high risk of complications and morbidity.  The differential diagnosis includes viral syndrome including influenza versus bacterial infection versus drug reaction versus other  Additional history obtained from patient's wife at bedside  I ordered and personally interpreted labs.  The pertinent results include FLU positive, CMP without evidence of significant dehydration, mild transminitis; mild thrombocytopenia. WBC and lactate wnl.   The patient was maintained on a cardiac monitor.  I personally viewed and interpreted the cardiac monitored which showed an underlying rhythm of: NSR  I ordered medication including IV fluids, IV morphine, IV toradol for pain, hydration I have reviewed the patients home medicines  and have made adjustments as needed - specifically recommending discontinuation of lamictal  Test Considered:  -Lower suspicion for bacterial meningitis after this clinical workup; I did not feel emergent LP was indicated at this time  After the interventions noted above, I reevaluated the patient and found that they have: improved  Dispostion:  After consideration of the diagnostic results and the patients response to treatment, I feel that the patent would benefit from PCP f/u for symptom recheck, lab recheck this week.         Final Clinical Impression(s) / ED Diagnoses Final diagnoses:  Influenza A  Transaminitis  Thrombocytopenia ([place])    Rx / DC Orders ED Discharge Orders          Ordered    oxyCODONE (ROXICODONE) 5 MG immediate release tablet  Every 6 hours PRN        06/17/21 1238              Wyvonnia Dusky, MD 06/17/21 [phone]

## 2021-06-17 NOTE — ED Notes (Signed)
Patient given discharge instructions. Questions were answered. Patient verbalized understanding of discharge instructions and care at home.  Discharged with spouse  

## 2021-06-20 DIAGNOSIS — R509 Fever, unspecified: Secondary | ICD-10-CM | POA: Diagnosis not present

## 2021-06-20 DIAGNOSIS — J09X2 Influenza due to identified novel influenza A virus with other respiratory manifestations: Secondary | ICD-10-CM | POA: Diagnosis not present

## 2021-06-20 DIAGNOSIS — T7840XA Allergy, unspecified, initial encounter: Secondary | ICD-10-CM | POA: Diagnosis not present

## 2021-06-22 LAB — CULTURE, BLOOD (SINGLE): Culture: NO GROWTH

## 2021-06-28 DIAGNOSIS — F331 Major depressive disorder, recurrent, moderate: Secondary | ICD-10-CM | POA: Diagnosis not present

## 2021-06-28 DIAGNOSIS — F419 Anxiety disorder, unspecified: Secondary | ICD-10-CM | POA: Diagnosis not present

## 2021-06-28 DIAGNOSIS — F3131 Bipolar disorder, current episode depressed, mild: Secondary | ICD-10-CM | POA: Diagnosis not present

## 2021-07-27 DIAGNOSIS — F3131 Bipolar disorder, current episode depressed, mild: Secondary | ICD-10-CM | POA: Diagnosis not present

## 2021-09-27 DIAGNOSIS — R7989 Other specified abnormal findings of blood chemistry: Secondary | ICD-10-CM | POA: Diagnosis not present

## 2021-09-27 DIAGNOSIS — Z0000 Encounter for general adult medical examination without abnormal findings: Secondary | ICD-10-CM | POA: Diagnosis not present

## 2021-09-27 DIAGNOSIS — Z125 Encounter for screening for malignant neoplasm of prostate: Secondary | ICD-10-CM | POA: Diagnosis not present

## 2021-10-02 DIAGNOSIS — J45909 Unspecified asthma, uncomplicated: Secondary | ICD-10-CM | POA: Diagnosis not present

## 2021-10-02 DIAGNOSIS — Z0000 Encounter for general adult medical examination without abnormal findings: Secondary | ICD-10-CM | POA: Diagnosis not present

## 2021-10-02 DIAGNOSIS — Z1339 Encounter for screening examination for other mental health and behavioral disorders: Secondary | ICD-10-CM | POA: Diagnosis not present

## 2021-10-02 DIAGNOSIS — Z1331 Encounter for screening for depression: Secondary | ICD-10-CM | POA: Diagnosis not present

## 2021-10-15 DIAGNOSIS — F419 Anxiety disorder, unspecified: Secondary | ICD-10-CM | POA: Diagnosis not present

## 2021-12-03 ENCOUNTER — Encounter: Payer: Self-pay | Admitting: *Deleted

## 2021-12-04 ENCOUNTER — Encounter: Payer: Self-pay | Admitting: Neurology

## 2021-12-04 ENCOUNTER — Ambulatory Visit (INDEPENDENT_AMBULATORY_CARE_PROVIDER_SITE_OTHER): Payer: BC Managed Care – PPO | Admitting: Neurology

## 2021-12-04 VITALS — BP 115/70 | HR 56 | Ht 67.0 in | Wt 163.4 lb

## 2021-12-04 DIAGNOSIS — R0681 Apnea, not elsewhere classified: Secondary | ICD-10-CM

## 2021-12-04 DIAGNOSIS — E663 Overweight: Secondary | ICD-10-CM

## 2021-12-04 DIAGNOSIS — G4763 Sleep related bruxism: Secondary | ICD-10-CM

## 2021-12-04 DIAGNOSIS — R001 Bradycardia, unspecified: Secondary | ICD-10-CM

## 2021-12-04 DIAGNOSIS — R0683 Snoring: Secondary | ICD-10-CM | POA: Diagnosis not present

## 2021-12-04 DIAGNOSIS — Z9189 Other specified personal risk factors, not elsewhere classified: Secondary | ICD-10-CM

## 2021-12-04 DIAGNOSIS — R635 Abnormal weight gain: Secondary | ICD-10-CM

## 2021-12-04 DIAGNOSIS — R351 Nocturia: Secondary | ICD-10-CM

## 2021-12-04 DIAGNOSIS — J4520 Mild intermittent asthma, uncomplicated: Secondary | ICD-10-CM

## 2021-12-04 NOTE — Patient Instructions (Signed)

## 2021-12-04 NOTE — Progress Notes (Signed)
Subjective:    Patient ID: Edwin Fox. is a 46 y.o. male.  HPI    Star Age, MD, PhD [facility] [address] [place], [place] [address]  Dear Dr. Ardeth Perfect,   I saw your patient, Edwin Fox, upon your kind request, in my sleep clinic today for initial consultation of his sleep disorder, in particular, concern for underlying obstructive sleep apnea.  The patient is unaccompanied today.  As you know, Edwin Fox is a 46 year old right-handed gentleman with an underlying medical history of asthma, allergies, arthritis, and borderline overweight state, who reports snoring and witnessed apneas per wife's report.  His snoring can be loud and disturbing enough for her but they do not always sleep in the same room together.  Edwin Fox recalls that his mom has loud snoring but no one has officially been diagnosed with sleep apnea in his family.  Edwin Fox has had some sleep disruption and daytime sleepiness.  His Epworth sleepiness score is 9 out of 24, fatigue severity score is 32 out of 63.  Edwin Fox has a history of sleep bruxism and had a dentist made occlusive guard but has not been able to tolerate it consistently.  Edwin Fox lives with his wife and 2 children, ages 79 and 29.  They have 2 cats in the household.  Edwin Fox has nocturia about once per average night, denies any recurrent nocturnal or morning headaches.  Edwin Fox limits his caffeine to 1 cup of coffee per day.  Edwin Fox quit smoking in 2005.  Edwin Fox drinks alcohol on the weekends, at one point Edwin Fox was drinking daily but cut back.  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  Bedtime is generally around 11 or 11:30 PM and rise time at 5:45 AM.  His Past Medical History Is Significant For: Past Medical History:  Diagnosis Date   Flu    Heart murmur    Osteoarthritis    neck and lower back    His Past Surgical History Is Significant For: Past Surgical History:  Procedure Laterality Date   VASECTOMY  03/2020   WISDOM TOOTH  EXTRACTION      His Family History Is Significant For: Family History  Problem Relation Age of Onset   Heart disease Mother    Diabetes Mellitus II Father    Sleep apnea Neg Hx     His Social History Is Significant For: Social History   Socioeconomic History   Marital status: Single    Spouse name: Not on file   Number of children: Not on file   Years of education: Not on file   Highest education level: Not on file  Occupational History   Not on file  Tobacco Use   Smoking status: Former    Types: Cigarettes    Quit date: 2005    Years since quitting: 18.5   Smokeless tobacco: Never  Substance and Sexual Activity   Alcohol use: Yes    Alcohol/week: 2.0 standard drinks of alcohol    Types: 2 Cans of beer per week   Drug use: Never   Sexual activity: Not on file  Other Topics Concern   Not on file  Social History Narrative   Caffeine 1-2 per day (coffee in am).    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   Cholecalciferol (VITAMIN D3 PO) Take by mouth daily.   levocetirizine (XYZAL) 5 MG tablet SMARTSIG:1 Tablet(s) By Mouth Every Evening   montelukast (SINGULAIR) 10 MG tablet Take 10 mg by mouth daily.   Multiple Vitamin (MULTIVITAMIN PO) Take by mouth daily.   No facility-administered encounter medications on file as of 12/04/2021.  :   Review of Systems:   Out of a complete 14 point review of systems, all are reviewed and negative with the exception of these symptoms as listed below:   Review of Systems  Neurological:        Pt here for sleep consult   Pt snores. Pt denies fatigue,hypertension,headaches,slrrp study,and CPAP machine   ESS:9 FSS :32    Objective:  Neurological Exam  Physical Exam Physical Examination:   Vitals:   12/04/21 1107  BP: 115/70  Pulse: (!) 56    General Examination: The patient is a very pleasant 46 y.o. male in no acute distress. Edwin Fox appears well-developed and well-nourished and well groomed.   HEENT: Normocephalic, atraumatic, pupils are equal, round and reactive to light, extraocular tracking is good without limitation to gaze excursion or nystagmus noted. Hearing is grossly intact. Face is symmetric with normal facial animation. Speech is clear with no dysarthria noted. There is no hypophonia. There is no lip, neck/head, jaw or voice tremor. Neck is supple with full range of passive and active motion. There are no carotid bruits on auscultation. Oropharynx exam reveals: mild mouth dryness, good dental hygiene and moderate airway crowding, due to small airway entry, larger uvula noted, tonsils on the smaller side, Mallampati class II.  Neck circumference of 14-5/8 inches.  Minimal to moderate overbite noted, evidence of tooth grinding especially front teeth.  Tongue protrudes centrally and palate elevates symmetrically.  Chest: Clear to auscultation without wheezing, rhonchi or crackles noted.  Heart: S1+S2+0, regular and normal without murmurs, rubs or gallops noted, bradycardia.   Abdomen: Soft, non-tender and non-distended.  Extremities: There is no obvious edema in the distal lower extremities bilaterally.   Skin: Warm and dry without trophic changes noted.   Musculoskeletal: exam reveals no obvious joint deformities.   Neurologically:  Mental status: The patient is awake, alert and oriented in  all 4 spheres. His immediate and remote memory, attention, language skills and fund of knowledge are appropriate. There is no evidence of aphasia, agnosia, apraxia or anomia. Speech is clear with normal prosody and enunciation. Thought process is linear. Mood is normal and affect is normal.  Cranial nerves II - XII are as described above under HEENT exam.  Motor exam: Normal bulk, strength and tone is noted. There is no obvious tremor. Fine motor skills and coordination: grossly intact.  Cerebellar testing: No dysmetria or intention tremor. There is no truncal or gait ataxia.  Sensory exam: intact to light touch in the upper and lower extremities.  Gait, station and balance: Edwin Fox stands easily. No veering to one side is noted. No leaning to one side is noted. Posture is age-appropriate and  stance is narrow based. Gait shows normal stride length and normal pace. No problems turning are noted.   Assessment and Plan:  In summary, Edwin Fox. is a very pleasant 46 y.o.-year old male with an underlying medical history of asthma, allergies, arthritis, and borderline overweight state, whose history and physical exam concerning for sleep disordered breathing, supporting a current working diagnosis of unspecified sleep apnea, with the main differential diagnoses of obstructive sleep apnea (OSA) versus upper airway resistance syndrome (UARS) versus central sleep apnea (CSA), or mixed sleep apnea. A laboratory attended sleep study is considered gold standard for evaluation of sleep disordered breathing and is recommended at this time and clinically justified.   I had a long chat with the patient about my findings and the diagnosis of sleep apnea, particularly OSA, its prognosis and treatment options. We talked about medical/conservative treatments, surgical interventions and non-pharmacological approaches for symptom control. I explained, in particular, the risks and ramifications of untreated moderate  to severe OSA, especially with respect to developing cardiovascular disease down the road, including congestive heart failure (CHF), difficult to treat hypertension, cardiac arrhythmias (particularly A-fib), neurovascular complications including TIA, stroke and dementia. Even type 2 diabetes has, in part, been linked to untreated OSA. Symptoms of untreated OSA may include (but may not be limited to) daytime sleepiness, nocturia (i.e. frequent nighttime urination), memory problems, mood irritability and suboptimally controlled or worsening mood disorder such as depression and/or anxiety, lack of energy, lack of motivation, physical discomfort, as well as recurrent headaches, especially morning or nocturnal headaches. We talked about the importance of maintaining a healthy lifestyle and striving for healthy weight.  I recommended the following at this time: sleep study.  I outlined the differences between a laboratory attended sleep study which is considered more comprehensive and accurate over the option of a home sleep test (HST); the latter may lead to underestimation of sleep disordered breathing in some instances and does not help with diagnosing upper airway resistance syndrome and is not accurate enough to diagnose primary central sleep apnea typically. I explained the different sleep test procedures to the patient in detail and also outlined possible surgical and non-surgical treatment options of OSA, including the use of a pressure airway pressure (PAP) device (ie CPAP, AutoPAP/APAP or BiPAP in certain circumstances), a custom-made dental device (aka oral appliance, which would require a referral to a specialist dentist or orthodontist typically, and is generally speaking not considered a good choice for patients with full dentures or edentulous state), upper airway surgical options, such as traditional UPPP (which is not considered a first-line treatment) or the Inspire device (hypoglossal nerve  stimulator, which would involve a referral for consultation with an ENT surgeon, after careful selection, following inclusion criteria). I explained the PAP treatment option to the patient in detail, as this is generally considered first-line treatment.  I explained the importance of being compliant with PAP treatment, not only for insurance purposes but primarily to improve patient's symptoms symptoms, and for the patient's long term health benefit, including to reduce His cardiovascular risks longer-term.    We will pick up our discussion about the next steps and treatment options after testing.  We will keep him posted as to the test results by phone call and/or MyChart messaging where possible.  We will plan to follow-up in sleep clinic accordingly as well.  I answered all his questions today and the patient was in agreement.   I encouraged him to call with any interim questions,  concerns, problems or updates or email [place] through [place].  Generally speaking, sleep test authorizations may take up to 2 weeks, sometimes less, sometimes longer, the patient is encouraged to get in touch with [place] if they do not hear back from the sleep lab staff directly within the next 2 weeks.  Thank you very much for allowing me to participate in the care of this nice patient. If I can be of any further assistance to you please do not hesitate to call me at [phone].  Sincerely,   Star Age, MD, PhD

## 2021-12-20 ENCOUNTER — Telehealth: Payer: Self-pay

## 2021-12-20 NOTE — Telephone Encounter (Signed)
LVM for pt to call back to schedule sleep study.  

## 2021-12-25 NOTE — Telephone Encounter (Signed)
Patient called back. He is scheduled at [facility] for 01/22/22 at 2:30 pm.  HST- BCBS auth: [id number] (exp. 12/18/21 to 02/15/22).  Mailed packet to the patient.

## 2021-12-26 ENCOUNTER — Ambulatory Visit (AMBULATORY_SURGERY_CENTER): Payer: BC Managed Care – PPO

## 2021-12-26 VITALS — Ht 67.0 in | Wt 165.0 lb

## 2021-12-26 DIAGNOSIS — Z1211 Encounter for screening for malignant neoplasm of colon: Secondary | ICD-10-CM

## 2021-12-26 MED ORDER — NA SULFATE-K SULFATE-MG SULF 17.5-3.13-1.6 GM/177ML PO SOLN: 1.0000 | ORAL | 0 refills | Status: DC

## 2021-12-26 NOTE — Progress Notes (Signed)
No egg or soy allergy known to patient  No issues known to pt with past sedation with any surgeries or procedures Patient denies ever being told they had issues or difficulty with intubation  No FH of Malignant Hyperthermia Pt is not on diet pills Pt is not on  home 02  Pt is not on blood thinners  Pt denies issues with constipation  No A fib or A flutter Have any cardiac testing pending--denied Pt instructed to use Singlecare.[email or web address] or GoodRx for a price reduction on prep   

## 2022-01-21 ENCOUNTER — Encounter: Payer: BC Managed Care – PPO | Admitting: Internal Medicine

## 2022-01-22 ENCOUNTER — Ambulatory Visit (INDEPENDENT_AMBULATORY_CARE_PROVIDER_SITE_OTHER): Payer: BC Managed Care – PPO | Admitting: Neurology

## 2022-01-22 ENCOUNTER — Encounter: Payer: Self-pay | Admitting: Internal Medicine

## 2022-01-22 DIAGNOSIS — E663 Overweight: Secondary | ICD-10-CM

## 2022-01-22 DIAGNOSIS — R0681 Apnea, not elsewhere classified: Secondary | ICD-10-CM

## 2022-01-22 DIAGNOSIS — R351 Nocturia: Secondary | ICD-10-CM

## 2022-01-22 DIAGNOSIS — Z9189 Other specified personal risk factors, not elsewhere classified: Secondary | ICD-10-CM

## 2022-01-22 DIAGNOSIS — G4733 Obstructive sleep apnea (adult) (pediatric): Secondary | ICD-10-CM

## 2022-01-22 DIAGNOSIS — R0683 Snoring: Secondary | ICD-10-CM

## 2022-01-22 DIAGNOSIS — G4763 Sleep related bruxism: Secondary | ICD-10-CM

## 2022-01-22 DIAGNOSIS — J4520 Mild intermittent asthma, uncomplicated: Secondary | ICD-10-CM

## 2022-01-22 DIAGNOSIS — R635 Abnormal weight gain: Secondary | ICD-10-CM

## 2022-01-22 DIAGNOSIS — R001 Bradycardia, unspecified: Secondary | ICD-10-CM

## 2022-01-23 NOTE — Progress Notes (Signed)
See procedure note.

## 2022-01-24 NOTE — Procedures (Signed)
   [facility]  HOME SLEEP TEST (Watch PAT) REPORT  STUDY DATE: 01/22/2022  DOB: 08-Sep-1975  MRN: [id number]  ORDERING CLINICIAN: Star Age, MD, PhD   REFERRING CLINICIAN: Velna Hatchet, MD   CLINICAL INFORMATION/HISTORY:  46 year old right-handed gentleman with an underlying medical history of asthma, allergies, arthritis, and borderline overweight state, who reports snoring and witnessed apneas.  Epworth sleepiness score: 9/24.  BMI: 25.6 kg/m  FINDINGS:   Sleep Summary:   Total Recording Time (hours, min): 7 hours, 5 min  Total Sleep Time (hours, min):  5 hours, 50 min  Percent REM (%):    16.7%   Respiratory Indices:   Calculated pAHI (per hour):  6.2/hour         REM pAHI:    5.2/hour       NREM pAHI: 6.4/hour  Central pAHI: 0.9/hour  Oxygen Saturation Statistics:    Oxygen Saturation (%) Mean: 95%   Minimum oxygen saturation (%):                 79%   O2 Saturation Range (%): 79-99%    O2 Saturation (minutes) <=88%: 0.1 min  Pulse Rate Statistics:   Pulse Mean (bpm):    45/min    Pulse Range (34-80/min)   IMPRESSION: OSA (obstructive sleep apnea)  RECOMMENDATION:  This home sleep test demonstrates overall mild obstructive sleep apnea with a total AHI of 6.2/hour and O2 nadir of 79% (briefly, only once). Snoring was detected, mostly intermittent, in the mild to moderate range. Given the patient's medical history and sleep related complaints, therapy with a  positive airway pressure device is a reasonable first-line choice and clinically recommended. Treatment can be achieved in the form of autoPAP trial/titration at home for now. A full night, in-lab PAP titration study may aid in improving proper treatment settings and with mask fit, if needed, down the road. Alternative treatments may include weight loss (where appropriate) along with avoidance of the supine sleep position (if possible), or an oral appliance in appropriate  candidates.   Please note that untreated obstructive sleep apnea may carry additional perioperative morbidity. Patients with significant obstructive sleep apnea should receive perioperative PAP therapy and the surgeons and particularly the anesthesiologist should be informed of the diagnosis and the severity of the sleep disordered breathing. The patient should be cautioned not to drive, work at heights, or operate dangerous or heavy equipment when tired or sleepy. Review and reiteration of good sleep hygiene measures should be pursued with any patient. Other causes of the patient's symptoms, including circadian rhythm disturbances, an underlying mood disorder, medication effect and/or an underlying medical problem cannot be ruled out based on this test. Clinical correlation is recommended.  The patient and his referring provider will be notified of the test results. The patient will be seen in follow up in sleep clinic at [facility], as necessary.  I certify that I have reviewed the raw data recording prior to the issuance of this report in accordance with the standards of the American Academy of Sleep Medicine (AASM).    INTERPRETING PHYSICIAN:   Star Age, MD, PhD  Board Certified in Neurology and Sleep Medicine  [facility] [address] [place], [place] [address] [phone]

## 2022-01-29 DIAGNOSIS — M25531 Pain in right wrist: Secondary | ICD-10-CM | POA: Diagnosis not present

## 2022-01-30 ENCOUNTER — Telehealth: Payer: Self-pay | Admitting: *Deleted

## 2022-01-30 DIAGNOSIS — G4733 Obstructive sleep apnea (adult) (pediatric): Secondary | ICD-10-CM

## 2022-01-30 NOTE — Telephone Encounter (Signed)
-----   Message from Star Age, MD sent at 01/24/2022  5:53 PM EDT ----- Patient referred by Dr. Ardeth Perfect, seen by me on 12/04/2021, HST 01/22/2022.    Please call and notify the patient that the recent home sleep test showed obstructive sleep apnea. OSA is overall mild, but worth treating to see if he feels better after treatment.  His overall AHI was 6.2/h, oxygen level dropped very briefly to the lower 80s and once to 79% and a few times to the higher 80s, overall oxygen saturations were mostly in the 90s for the majority of the night.  I would be happy to write an AutoPap machine for him to trial at home.  If he would like a prescription, we will send the order to the DME company and we will have him follow-up in sleep clinic in about 1 to 3 months after starting treatment at home.  Alternative treatment options may include an oral appliance which is through dentistry, if he would like to have a referral to a dentist for consideration of a dental device, we can facilitate with a referral.  Please let me know how he would like to proceed.   Star Age, MD, PhD [facility])

## 2022-01-30 NOTE — Telephone Encounter (Signed)
Called pt & LVM asking for call back.  

## 2022-01-31 NOTE — Telephone Encounter (Signed)
autoPAP order placed.

## 2022-01-31 NOTE — Telephone Encounter (Signed)
Pt called back. Requesting a return call.

## 2022-01-31 NOTE — Addendum Note (Signed)
Addended by: Star Age on: 01/31/2022 06:43 PM   Modules accepted: Orders

## 2022-01-31 NOTE — Telephone Encounter (Signed)
I spoke with the patient and discussed his sleep study results. The patient verbalized understanding. He wants to see if insurance will cover autopap. He is interested but not 100% sure if he will start. Pt doesn't have DME preference. Will refer to [facility]. Pt understands insurance compliance requirements which includes using the machine at least 4 hours at night and also being seen in the office between 30 and 90 days after setup. He scheduled an initial f/u for Thursday 05/16/22 at 345 pm. His questions were answered and he verbalized appreciation for the call.

## 2022-02-04 NOTE — Telephone Encounter (Signed)
Autopap referral faxed to [place]. Received a receipt of confirmation.

## 2022-02-06 ENCOUNTER — Ambulatory Visit (AMBULATORY_SURGERY_CENTER): Payer: BC Managed Care – PPO | Admitting: Internal Medicine

## 2022-02-06 ENCOUNTER — Encounter: Payer: Self-pay | Admitting: Internal Medicine

## 2022-02-06 VITALS — BP 98/62 | HR 39 | Temp 97.5°F | Resp 12 | Ht 67.0 in | Wt 165.0 lb

## 2022-02-06 DIAGNOSIS — D122 Benign neoplasm of ascending colon: Secondary | ICD-10-CM | POA: Diagnosis not present

## 2022-02-06 DIAGNOSIS — Z1211 Encounter for screening for malignant neoplasm of colon: Secondary | ICD-10-CM | POA: Diagnosis not present

## 2022-02-06 MED ORDER — SODIUM CHLORIDE 0.9 % IV SOLN: 500.0000 mL | Freq: Once | INTRAVENOUS | Status: DC

## 2022-02-06 NOTE — Progress Notes (Signed)
Pt's states no medical or surgical changes since previsit or office visit. 

## 2022-02-06 NOTE — Op Note (Signed)
[place] Patient Name: Edwin Fox Procedure Date: 02/06/2022 10:10 AM MRN: [id number] Endoscopist: Sonny Masters "Christia Fox ,  Age: 46 Referring MD:  Date of Birth: August 15, 1975 Gender: Male Account #: [id number] Procedure:                Colonoscopy Indications:              Screening for colorectal malignant neoplasm, This                            is the patient's first colonoscopy Medicines:                Monitored Anesthesia Care Procedure:                Pre-Anesthesia Assessment:                           - Prior to the procedure, a History and Physical                            was performed, and patient medications and                            allergies were reviewed. The patient's tolerance of                            previous anesthesia was also reviewed. The risks                            and benefits of the procedure and the sedation                            options and risks were discussed with the patient.                            All questions were answered, and informed consent                            was obtained. Prior Anticoagulants: The patient has                            taken no previous anticoagulant or antiplatelet                            agents. ASA Grade Assessment: II - A patient with                            mild systemic disease. After reviewing the risks                            and benefits, the patient was deemed in                            satisfactory condition to undergo the procedure.  After obtaining informed consent, the colonoscope                            was passed under direct vision. Throughout the                            procedure, the patient's blood pressure, pulse, and                            oxygen saturations were monitored continuously. The                            Olympus CF-HQ190L ([id number]) Colonoscope was                            introduced through the  anus and advanced to the the                            terminal ileum. The colonoscopy was performed                            without difficulty. The patient tolerated the                            procedure well. The quality of the bowel                            preparation was good. The terminal ileum, ileocecal                            valve, appendiceal orifice, and rectum were                            photographed. Scope In: 10:16:35 AM Scope Out: 10:34:33 AM Scope Withdrawal Time: 0 hours 14 minutes 20 seconds  Total Procedure Duration: 0 hours 17 minutes 58 seconds  Findings:                 The terminal ileum appeared normal.                           A 5 mm polyp was found in the ascending colon. The                            polyp was sessile. The polyp was removed with a                            cold snare. Resection and retrieval were complete.                           Non-bleeding internal hemorrhoids were found during                            retroflexion. Complications:  No immediate complications. Estimated Blood Loss:     Estimated blood loss was minimal. Impression:               - The examined portion of the ileum was normal.                           - One 5 mm polyp in the ascending colon, removed                            with a cold snare. Resected and retrieved.                           - Non-bleeding internal hemorrhoids. Recommendation:           - Discharge patient to home (with escort).                           - Await pathology results.                           - The findings and recommendations were discussed                            with the patient. Dr Georgian Co "Delta" Amory,  02/06/2022 10:37:22 AM

## 2022-02-06 NOTE — Progress Notes (Signed)
GASTROENTEROLOGY PROCEDURE H&P NOTE   Primary Care Physician: Alysia Penna, MD    Reason for Procedure:   Colon cancer screening  Plan:    Colonoscopy  Patient is appropriate for endoscopic procedure(s) in the ambulatory ([facility]) setting.  The nature of the procedure, as well as the risks, benefits, and alternatives were carefully and thoroughly reviewed with the patient. Ample time for discussion and questions allowed. The patient understood, was satisfied, and agreed to proceed.     HPI: Edwin Fox. is a 46 y.o. male who presents for colonoscopy for colon cancer screening. Denies blood in stools, changes in bowel habits, weight loss. Has one aunt and cousin with colon cancer.  Past Medical History:  Diagnosis Date   Allergy    Asthma    Flu    Heart murmur    childhood   Osteoarthritis    neck and lower back   Sleep apnea     Past Surgical History:  Procedure Laterality Date   VASECTOMY  03/2020   WISDOM TOOTH EXTRACTION      Prior to Admission medications   Medication Sig Start Date End Date Taking? Authorizing Provider  albuterol (VENTOLIN HFA) 108 (90 Base) MCG/ACT inhaler Inhale into the lungs as needed for wheezing or shortness of breath.    [provider]  Cholecalciferol (VITAMIN D3 PO) Take by mouth daily.    [provider]  levocetirizine (XYZAL) 5 MG tablet SMARTSIG:1 Tablet(s) By Mouth Every Evening 03/13/20   [provider]  meloxicam (MOBIC) 7.5 MG tablet Take 7.5 mg by mouth 2 (two) times daily. 01/29/22   [provider]  montelukast (SINGULAIR) 10 MG tablet Take 10 mg by mouth daily. 06/04/20   [provider]  Multiple Vitamin (MULTIVITAMIN PO) Take by mouth daily.    [provider]  Na Sulfate-K Sulfate-Mg Sulf 17.5-3.13-1.6 GM/177ML SOLN Take 1 kit by mouth as directed. May use generic Suprep, no prior authorization. Take as directed. 12/26/21   Imogene Burn, MD     Current Outpatient Medications  Medication Sig Dispense Refill   albuterol (VENTOLIN HFA) 108 (90 Base) MCG/ACT inhaler Inhale into the lungs as needed for wheezing or shortness of breath.     Cholecalciferol (VITAMIN D3 PO) Take by mouth daily.     levocetirizine (XYZAL) 5 MG tablet SMARTSIG:1 Tablet(s) By Mouth Every Evening     meloxicam (MOBIC) 7.5 MG tablet Take 7.5 mg by mouth 2 (two) times daily.     montelukast (SINGULAIR) 10 MG tablet Take 10 mg by mouth daily.     Multiple Vitamin (MULTIVITAMIN PO) Take by mouth daily.     Na Sulfate-K Sulfate-Mg Sulf 17.5-3.13-1.6 GM/177ML SOLN Take 1 kit by mouth as directed. May use generic Suprep, no prior authorization. Take as directed. 354 mL 0   Current Facility-Administered Medications  Medication Dose Route Frequency Provider Last Rate Last Admin   0.9 %  sodium chloride infusion  500 mL Intravenous Once Imogene Burn, MD        Allergies as of 02/06/2022 - Review Complete 02/06/2022  Allergen Reaction Noted   Lamotrigine Dermatitis and Rash 06/15/2021    Family History  Problem Relation Age of Onset   Heart disease Mother    Diabetes Mellitus II Father    Colon cancer Maternal Aunt    Sleep apnea Neg Hx    Colon polyps Neg Hx    Esophageal cancer Neg Hx    Stomach cancer Neg  Hx    Rectal cancer Neg Hx     Social History   Socioeconomic History   Marital status: Single    Spouse name: Not on file   Number of children: Not on file   Years of education: Not on file   Highest education level: Not on file  Occupational History   Not on file  Tobacco Use   Smoking status: Former    Types: Cigarettes    Quit date: 2005    Years since quitting: 18.7   Smokeless tobacco: Never  Substance and Sexual Activity   Alcohol use: Yes    Alcohol/week: 2.0 standard drinks of alcohol    Types: 2 Cans of beer per week   Drug use: Never   Sexual activity: Not on file  Other Topics Concern   Not on file  Social History  Narrative   Caffeine 1-2 per day (coffee in am).    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

## 2022-02-06 NOTE — Progress Notes (Signed)
Pt resting comfortably. VSS. Airway intact. SBAR complete to RN. All questions answered.   

## 2022-02-06 NOTE — Patient Instructions (Signed)
Await pathology results.  Handout on polyps provided.  YOU HAD AN ENDOSCOPIC PROCEDURE TODAY AT THE [facility]:   Refer to the procedure report that was given to you for any specific questions about what was found during the examination.  If the procedure report does not answer your questions, please call your gastroenterologist to clarify.  If you requested that your care partner not be given the details of your procedure findings, then the procedure report has been included in a sealed envelope for you to review at your convenience later.  YOU SHOULD EXPECT: Some feelings of bloating in the abdomen. Passage of more gas than usual.  Walking can help get rid of the air that was put into your GI tract during the procedure and reduce the bloating. If you had a lower endoscopy (such as a colonoscopy or flexible sigmoidoscopy) you may notice spotting of blood in your stool or on the toilet paper. If you underwent a bowel prep for your procedure, you may not have a normal bowel movement for a few days.  Please Note:  You might notice some irritation and congestion in your nose or some drainage.  This is from the oxygen used during your procedure.  There is no need for concern and it should clear up in a day or so.  SYMPTOMS TO REPORT IMMEDIATELY:  Following lower endoscopy (colonoscopy or flexible sigmoidoscopy):  Excessive amounts of blood in the stool  Significant tenderness or worsening of abdominal pains  Swelling of the abdomen that is new, acute  Fever of 100F or higher   For urgent or emergent issues, a gastroenterologist can be reached at any hour by calling [phone]. Do not use MyChart messaging for urgent concerns.    DIET:  We do recommend a small meal at first, but then you may proceed to your regular diet.  Drink plenty of fluids but you should avoid alcoholic beverages for 24 hours.  ACTIVITY:  You should plan to take it easy for the rest of today and you should  NOT DRIVE or use heavy machinery until tomorrow (because of the sedation medicines used during the test).    FOLLOW UP: Our staff will call the number listed on your records the next business day following your procedure.  We will call around 7:15- 8:00 am to check on you and address any questions or concerns that you may have regarding the information given to you following your procedure. If we do not reach you, we will leave a message.     If any biopsies were taken you will be contacted by phone or by letter within the next 1-3 weeks.  Please call us at [phone] if you have not heard about the biopsies in 3 weeks.    SIGNATURES/CONFIDENTIALITY: You and/or your care partner have signed paperwork which will be entered into your electronic medical record.  These signatures attest to the fact that that the information above on your After Visit Summary has been reviewed and is understood.  Full responsibility of the confidentiality of this discharge information lies with you and/or your care-partner.  

## 2022-02-06 NOTE — Progress Notes (Signed)
Called to room to assist during endoscopic procedure.  Patient ID and intended procedure confirmed with present staff. Received instructions for my participation in the procedure from the performing physician.  

## 2022-02-07 ENCOUNTER — Telehealth: Payer: Self-pay

## 2022-02-07 NOTE — Telephone Encounter (Signed)
Left message on follow up call. 

## 2022-02-08 ENCOUNTER — Encounter: Payer: Self-pay | Admitting: Internal Medicine

## 2022-02-13 DIAGNOSIS — M25531 Pain in right wrist: Secondary | ICD-10-CM | POA: Diagnosis not present

## 2022-02-19 DIAGNOSIS — M25531 Pain in right wrist: Secondary | ICD-10-CM | POA: Diagnosis not present

## 2022-02-27 DIAGNOSIS — G4733 Obstructive sleep apnea (adult) (pediatric): Secondary | ICD-10-CM | POA: Diagnosis not present

## 2022-03-06 DIAGNOSIS — J3081 Allergic rhinitis due to animal (cat) (dog) hair and dander: Secondary | ICD-10-CM | POA: Diagnosis not present

## 2022-03-06 DIAGNOSIS — J3089 Other allergic rhinitis: Secondary | ICD-10-CM | POA: Diagnosis not present

## 2022-03-06 DIAGNOSIS — J4520 Mild intermittent asthma, uncomplicated: Secondary | ICD-10-CM | POA: Diagnosis not present

## 2022-03-06 DIAGNOSIS — J301 Allergic rhinitis due to pollen: Secondary | ICD-10-CM | POA: Diagnosis not present

## 2022-03-19 DIAGNOSIS — M25531 Pain in right wrist: Secondary | ICD-10-CM | POA: Diagnosis not present

## 2022-03-25 ENCOUNTER — Telehealth: Payer: Self-pay | Admitting: *Deleted

## 2022-03-25 NOTE — Telephone Encounter (Signed)
If he would like a referral to dentistry for treatment of mild sleep apnea, we can certainly facilitate with a referral. Please let pt know.

## 2022-03-25 NOTE — Telephone Encounter (Signed)
Received fax from [facility] that pt has refused service/ release of liability with [employer] (signed by pt).    Results:   Please call and notify the patient that the recent home sleep test showed obstructive sleep apnea. OSA is overall mild, but worth treating to see if he feels better after treatment.  His overall AHI was 6.2/h, oxygen level dropped very briefly to the lower 80s and once to 79% and a few times to the higher 80s, overall oxygen saturations were mostly in the 90s for the majority of the night.  I would be happy to write an [employer] machine for him to trial at home.  If he would like a prescription, we will send the order to the DME company and we will have him follow-up in sleep clinic in about 1 to 3 months after starting treatment at home.  Alternative treatment options may include an oral appliance which is through dentistry, if he would like to have a referral to a dentist for consideration of a dental device, we can facilitate with a referral.  Please let me know how he would like to proceed.   Star Age, MD, PhD [facility])

## 2022-04-19 DIAGNOSIS — H5213 Myopia, bilateral: Secondary | ICD-10-CM | POA: Diagnosis not present

## 2022-04-19 DIAGNOSIS — H40023 Open angle with borderline findings, high risk, bilateral: Secondary | ICD-10-CM | POA: Diagnosis not present

## 2022-04-19 DIAGNOSIS — H52203 Unspecified astigmatism, bilateral: Secondary | ICD-10-CM | POA: Diagnosis not present

## 2022-05-16 ENCOUNTER — Ambulatory Visit: Payer: BC Managed Care – PPO | Admitting: Adult Health

## 2022-05-23 ENCOUNTER — Ambulatory Visit: Payer: Self-pay | Admitting: Adult Health

## 2022-05-26 DIAGNOSIS — J101 Influenza due to other identified influenza virus with other respiratory manifestations: Secondary | ICD-10-CM | POA: Diagnosis not present

## 2022-05-26 DIAGNOSIS — R059 Cough, unspecified: Secondary | ICD-10-CM | POA: Diagnosis not present

## 2022-05-26 DIAGNOSIS — R6889 Other general symptoms and signs: Secondary | ICD-10-CM | POA: Diagnosis not present

## 2022-05-26 DIAGNOSIS — R0981 Nasal congestion: Secondary | ICD-10-CM | POA: Diagnosis not present

## 2022-05-26 DIAGNOSIS — Z20822 Contact with and (suspected) exposure to covid-19: Secondary | ICD-10-CM | POA: Diagnosis not present

## 2022-06-27 DIAGNOSIS — M79644 Pain in right finger(s): Secondary | ICD-10-CM | POA: Diagnosis not present

## 2022-06-27 DIAGNOSIS — M25531 Pain in right wrist: Secondary | ICD-10-CM | POA: Diagnosis not present

## 2022-06-27 DIAGNOSIS — M79641 Pain in right hand: Secondary | ICD-10-CM | POA: Diagnosis not present

## 2022-07-30 DIAGNOSIS — M25531 Pain in right wrist: Secondary | ICD-10-CM | POA: Diagnosis not present

## 2022-07-30 DIAGNOSIS — M79641 Pain in right hand: Secondary | ICD-10-CM | POA: Diagnosis not present

## 2022-09-09 DIAGNOSIS — S63650A Sprain of metacarpophalangeal joint of right index finger, initial encounter: Secondary | ICD-10-CM | POA: Diagnosis not present

## 2022-10-11 DIAGNOSIS — Z125 Encounter for screening for malignant neoplasm of prostate: Secondary | ICD-10-CM | POA: Diagnosis not present

## 2022-10-11 DIAGNOSIS — Z0000 Encounter for general adult medical examination without abnormal findings: Secondary | ICD-10-CM | POA: Diagnosis not present

## 2022-10-11 DIAGNOSIS — D696 Thrombocytopenia, unspecified: Secondary | ICD-10-CM | POA: Diagnosis not present

## 2022-10-21 DIAGNOSIS — R7302 Impaired glucose tolerance (oral): Secondary | ICD-10-CM | POA: Diagnosis not present

## 2022-10-21 DIAGNOSIS — Z0000 Encounter for general adult medical examination without abnormal findings: Secondary | ICD-10-CM | POA: Diagnosis not present

## 2022-10-21 DIAGNOSIS — Z1331 Encounter for screening for depression: Secondary | ICD-10-CM | POA: Diagnosis not present

## 2022-10-21 DIAGNOSIS — Z1339 Encounter for screening examination for other mental health and behavioral disorders: Secondary | ICD-10-CM | POA: Diagnosis not present

## 2022-11-25 DIAGNOSIS — N4341 Spermatocele of epididymis, single: Secondary | ICD-10-CM | POA: Diagnosis not present

## 2023-02-12 DIAGNOSIS — M79641 Pain in right hand: Secondary | ICD-10-CM | POA: Diagnosis not present

## 2023-02-12 DIAGNOSIS — M65839 Other synovitis and tenosynovitis, unspecified forearm: Secondary | ICD-10-CM | POA: Diagnosis not present

## 2023-02-28 DIAGNOSIS — J3089 Other allergic rhinitis: Secondary | ICD-10-CM | POA: Diagnosis not present

## 2023-02-28 DIAGNOSIS — J3081 Allergic rhinitis due to animal (cat) (dog) hair and dander: Secondary | ICD-10-CM | POA: Diagnosis not present

## 2023-02-28 DIAGNOSIS — J4520 Mild intermittent asthma, uncomplicated: Secondary | ICD-10-CM | POA: Diagnosis not present

## 2023-02-28 DIAGNOSIS — J301 Allergic rhinitis due to pollen: Secondary | ICD-10-CM | POA: Diagnosis not present

## 2023-04-16 DIAGNOSIS — M65831 Other synovitis and tenosynovitis, right forearm: Secondary | ICD-10-CM | POA: Diagnosis not present

## 2023-04-16 DIAGNOSIS — S6991XA Unspecified injury of right wrist, hand and finger(s), initial encounter: Secondary | ICD-10-CM | POA: Diagnosis not present

## 2023-04-21 DIAGNOSIS — M13831 Other specified arthritis, right wrist: Secondary | ICD-10-CM | POA: Diagnosis not present

## 2023-04-21 DIAGNOSIS — S63650A Sprain of metacarpophalangeal joint of right index finger, initial encounter: Secondary | ICD-10-CM | POA: Diagnosis not present

## 2023-04-21 DIAGNOSIS — M25831 Other specified joint disorders, right wrist: Secondary | ICD-10-CM | POA: Diagnosis not present

## 2023-04-24 DIAGNOSIS — H5213 Myopia, bilateral: Secondary | ICD-10-CM | POA: Diagnosis not present

## 2023-09-22 DIAGNOSIS — M79641 Pain in right hand: Secondary | ICD-10-CM | POA: Diagnosis not present

## 2023-09-24 DIAGNOSIS — S63650A Sprain of metacarpophalangeal joint of right index finger, initial encounter: Secondary | ICD-10-CM | POA: Diagnosis not present

## 2023-11-03 DIAGNOSIS — D696 Thrombocytopenia, unspecified: Secondary | ICD-10-CM | POA: Diagnosis not present

## 2023-11-03 DIAGNOSIS — Z125 Encounter for screening for malignant neoplasm of prostate: Secondary | ICD-10-CM | POA: Diagnosis not present

## 2023-11-10 DIAGNOSIS — Z0000 Encounter for general adult medical examination without abnormal findings: Secondary | ICD-10-CM | POA: Diagnosis not present

## 2023-11-10 DIAGNOSIS — Z1339 Encounter for screening examination for other mental health and behavioral disorders: Secondary | ICD-10-CM | POA: Diagnosis not present

## 2023-11-10 DIAGNOSIS — Z1331 Encounter for screening for depression: Secondary | ICD-10-CM | POA: Diagnosis not present

## 2023-11-27 DIAGNOSIS — L700 Acne vulgaris: Secondary | ICD-10-CM | POA: Diagnosis not present

## 2023-11-27 DIAGNOSIS — D2262 Melanocytic nevi of left upper limb, including shoulder: Secondary | ICD-10-CM | POA: Diagnosis not present

## 2023-11-27 DIAGNOSIS — D225 Melanocytic nevi of trunk: Secondary | ICD-10-CM | POA: Diagnosis not present

## 2023-11-27 DIAGNOSIS — D2261 Melanocytic nevi of right upper limb, including shoulder: Secondary | ICD-10-CM | POA: Diagnosis not present

## 2024-01-29 DIAGNOSIS — S62337A Displaced fracture of neck of fifth metacarpal bone, left hand, initial encounter for closed fracture: Secondary | ICD-10-CM | POA: Diagnosis not present

## 2024-02-02 DIAGNOSIS — M79645 Pain in left finger(s): Secondary | ICD-10-CM | POA: Diagnosis not present

## 2024-02-16 DIAGNOSIS — M79645 Pain in left finger(s): Secondary | ICD-10-CM | POA: Diagnosis not present

## 2024-02-16 DIAGNOSIS — S62337D Displaced fracture of neck of fifth metacarpal bone, left hand, subsequent encounter for fracture with routine healing: Secondary | ICD-10-CM | POA: Diagnosis not present

## 2024-02-26 DIAGNOSIS — M79645 Pain in left finger(s): Secondary | ICD-10-CM | POA: Diagnosis not present

## 2024-02-26 DIAGNOSIS — S62337D Displaced fracture of neck of fifth metacarpal bone, left hand, subsequent encounter for fracture with routine healing: Secondary | ICD-10-CM | POA: Diagnosis not present

## 2024-02-27 DIAGNOSIS — J3081 Allergic rhinitis due to animal (cat) (dog) hair and dander: Secondary | ICD-10-CM | POA: Diagnosis not present

## 2024-02-27 DIAGNOSIS — J4520 Mild intermittent asthma, uncomplicated: Secondary | ICD-10-CM | POA: Diagnosis not present

## 2024-02-27 DIAGNOSIS — J3089 Other allergic rhinitis: Secondary | ICD-10-CM | POA: Diagnosis not present

## 2024-02-27 DIAGNOSIS — J301 Allergic rhinitis due to pollen: Secondary | ICD-10-CM | POA: Diagnosis not present

## 2024-03-18 DIAGNOSIS — S62337D Displaced fracture of neck of fifth metacarpal bone, left hand, subsequent encounter for fracture with routine healing: Secondary | ICD-10-CM | POA: Diagnosis not present

## 2024-04-30 DIAGNOSIS — H21233 Degeneration of iris (pigmentary), bilateral: Secondary | ICD-10-CM | POA: Diagnosis not present

## 2024-04-30 DIAGNOSIS — H5213 Myopia, bilateral: Secondary | ICD-10-CM | POA: Diagnosis not present
